# Patient Record
Sex: Male | Born: 1970 | Race: White | Hispanic: No | Marital: Single | Smoking: Former smoker
Health system: Southern US, Community
[De-identification: ages and names within clinical notes are randomized; demographics above are authoritative.]

## PROBLEM LIST (undated history)

## (undated) DIAGNOSIS — M549 Dorsalgia, unspecified: Secondary | ICD-10-CM

## (undated) DIAGNOSIS — F419 Anxiety disorder, unspecified: Secondary | ICD-10-CM

## (undated) HISTORY — PX: ANKLE SURGERY: SHX546

## (undated) HISTORY — DX: Anxiety disorder, unspecified: F41.9

## (undated) HISTORY — DX: Dorsalgia, unspecified: M54.9

---

## 1999-04-30 ENCOUNTER — Emergency Department (HOSPITAL_COMMUNITY): Admission: EM | Admit: 1999-04-30 | Discharge: 1999-04-30 | Payer: Self-pay | Admitting: Emergency Medicine

## 1999-05-07 ENCOUNTER — Emergency Department (HOSPITAL_COMMUNITY): Admission: EM | Admit: 1999-05-07 | Discharge: 1999-05-07 | Payer: Self-pay | Admitting: Emergency Medicine

## 2001-04-21 ENCOUNTER — Encounter: Payer: Self-pay | Admitting: *Deleted

## 2001-04-21 ENCOUNTER — Encounter: Admission: RE | Admit: 2001-04-21 | Discharge: 2001-04-21 | Payer: Self-pay | Admitting: *Deleted

## 2007-11-11 ENCOUNTER — Emergency Department (HOSPITAL_COMMUNITY): Admission: EM | Admit: 2007-11-11 | Discharge: 2007-11-11 | Payer: Self-pay | Admitting: Emergency Medicine

## 2009-11-08 ENCOUNTER — Ambulatory Visit (HOSPITAL_COMMUNITY): Admission: RE | Admit: 2009-11-08 | Discharge: 2009-11-08 | Payer: Self-pay | Admitting: Gastroenterology

## 2009-11-16 ENCOUNTER — Ambulatory Visit (HOSPITAL_COMMUNITY): Admission: RE | Admit: 2009-11-16 | Discharge: 2009-11-16 | Payer: Self-pay | Admitting: Gastroenterology

## 2009-11-24 ENCOUNTER — Encounter: Admission: RE | Admit: 2009-11-24 | Discharge: 2009-11-24 | Payer: Self-pay | Admitting: Gastroenterology

## 2010-06-24 ENCOUNTER — Encounter: Payer: Self-pay | Admitting: Gastroenterology

## 2010-10-12 ENCOUNTER — Emergency Department (HOSPITAL_COMMUNITY)
Admission: EM | Admit: 2010-10-12 | Discharge: 2010-10-13 | Disposition: A | Discharge status: Home / Self Care | Payer: Self-pay | Attending: Emergency Medicine | Admitting: Emergency Medicine

## 2010-10-12 DIAGNOSIS — F1410 Cocaine abuse, uncomplicated: Secondary | ICD-10-CM | POA: Insufficient documentation

## 2010-10-12 DIAGNOSIS — Z79899 Other long term (current) drug therapy: Secondary | ICD-10-CM | POA: Insufficient documentation

## 2010-10-12 DIAGNOSIS — F329 Major depressive disorder, single episode, unspecified: Secondary | ICD-10-CM | POA: Insufficient documentation

## 2010-10-12 DIAGNOSIS — F3289 Other specified depressive episodes: Secondary | ICD-10-CM | POA: Insufficient documentation

## 2010-10-12 DIAGNOSIS — F1010 Alcohol abuse, uncomplicated: Secondary | ICD-10-CM | POA: Insufficient documentation

## 2010-10-12 LAB — COMPREHENSIVE METABOLIC PANEL
Albumin: 4.2 g/dL (ref 3.5–5.2)
BUN: 11 mg/dL (ref 6–23)
Calcium: 9.9 mg/dL (ref 8.4–10.5)
Chloride: 102 mEq/L (ref 96–112)
Creatinine, Ser: 1.02 mg/dL (ref 0.4–1.5)
GFR calc Af Amer: >60 mL/min (ref >60)
Total Bilirubin: 0.4 mg/dL (ref 0.3–1.2)

## 2010-10-12 LAB — URINALYSIS, ROUTINE W REFLEX MICROSCOPIC
Bilirubin Urine: NEGATIVE
Hgb urine dipstick: NEGATIVE
Ketones, ur: NEGATIVE mg/dL
Nitrite: NEGATIVE
Specific Gravity, Urine: 1.022 (ref 1.005–1.030)
pH: 5.5 (ref 5.0–8.0)

## 2010-10-12 LAB — DIFFERENTIAL
Basophils Absolute: 0 10*3/uL (ref 0.0–0.1)
Lymphocytes Relative: 20 % (ref 12–46)
Lymphs Abs: 1.6 10*3/uL (ref 0.7–4.0)
Neutro Abs: 5.1 10*3/uL (ref 1.7–7.7)

## 2010-10-12 LAB — CBC
HCT: 50.4 % (ref 39.0–52.0)
Hemoglobin: 17 g/dL (ref 13.0–17.0)
MCV: 94.6 fL (ref 78.0–100.0)
RBC: 5.33 MIL/uL (ref 4.22–5.81)
WBC: 7.7 10*3/uL (ref 4.0–10.5)

## 2010-10-12 LAB — URINE MICROSCOPIC-ADD ON

## 2010-10-12 LAB — RAPID URINE DRUG SCREEN, HOSP PERFORMED
Amphetamines: NOT DETECTED
Benzodiazepines: POSITIVE — AB
Cocaine: POSITIVE — AB
Opiates: NOT DETECTED
Tetrahydrocannabinol: NOT DETECTED

## 2013-05-10 ENCOUNTER — Encounter (HOSPITAL_COMMUNITY): Payer: Self-pay | Admitting: Emergency Medicine

## 2013-05-10 ENCOUNTER — Emergency Department (HOSPITAL_COMMUNITY)
Admission: EM | Admit: 2013-05-10 | Discharge: 2013-05-10 | Disposition: A | Discharge status: Home / Self Care | Payer: BC Managed Care – PPO | Attending: Emergency Medicine | Admitting: Emergency Medicine

## 2013-05-10 ENCOUNTER — Emergency Department (HOSPITAL_COMMUNITY): Payer: BC Managed Care – PPO

## 2013-05-10 DIAGNOSIS — R1013 Epigastric pain: Secondary | ICD-10-CM | POA: Insufficient documentation

## 2013-05-10 DIAGNOSIS — E876 Hypokalemia: Secondary | ICD-10-CM | POA: Insufficient documentation

## 2013-05-10 DIAGNOSIS — R109 Unspecified abdominal pain: Secondary | ICD-10-CM

## 2013-05-10 DIAGNOSIS — J029 Acute pharyngitis, unspecified: Secondary | ICD-10-CM | POA: Insufficient documentation

## 2013-05-10 DIAGNOSIS — R160 Hepatomegaly, not elsewhere classified: Secondary | ICD-10-CM

## 2013-05-10 DIAGNOSIS — K920 Hematemesis: Secondary | ICD-10-CM | POA: Insufficient documentation

## 2013-05-10 LAB — URINALYSIS, ROUTINE W REFLEX MICROSCOPIC
Bilirubin Urine: NEGATIVE
Glucose, UA: NEGATIVE mg/dL
Ketones, ur: NEGATIVE mg/dL
pH: 7.5 (ref 5.0–8.0)

## 2013-05-10 LAB — CBC
Hemoglobin: 16.4 g/dL (ref 13.0–17.0)
MCV: 92.9 fL (ref 78.0–100.0)
Platelets: 261 10*3/uL (ref 150–400)
RBC: 5.07 MIL/uL (ref 4.22–5.81)
WBC: 15.8 10*3/uL — ABNORMAL HIGH (ref 4.0–10.5)

## 2013-05-10 LAB — COMPREHENSIVE METABOLIC PANEL
ALT: 47 U/L (ref 0–53)
AST: 35 U/L (ref 0–37)
CO2: 28 mEq/L (ref 19–32)
Chloride: 96 mEq/L (ref 96–112)
Creatinine, Ser: 1.09 mg/dL (ref 0.50–1.35)
GFR calc Af Amer: >90 mL/min (ref >90)
GFR calc non Af Amer: 81 mL/min — ABNORMAL LOW (ref >90)
Glucose, Bld: 107 mg/dL — ABNORMAL HIGH (ref 70–99)
Sodium: 138 mEq/L (ref 135–145)
Total Bilirubin: 0.8 mg/dL (ref 0.3–1.2)

## 2013-05-10 LAB — POCT I-STAT, CHEM 8
BUN: 9 mg/dL (ref 6–23)
Chloride: 95 mEq/L — ABNORMAL LOW (ref 96–112)
Creatinine, Ser: 1.1 mg/dL (ref 0.50–1.35)
Glucose, Bld: 113 mg/dL — ABNORMAL HIGH (ref 70–99)
HCT: 53 % — ABNORMAL HIGH (ref 39.0–52.0)
Potassium: 2.6 mEq/L — CL (ref 3.5–5.1)
Sodium: 140 mEq/L (ref 135–145)

## 2013-05-10 MED ORDER — POTASSIUM CHLORIDE CRYS ER 20 MEQ PO TBCR
40.0000 meq | EXTENDED_RELEASE_TABLET | Freq: Once | ORAL | Status: AC
  Administered 2013-05-10: 40 meq via ORAL
  Filled 2013-05-10: qty 2

## 2013-05-10 MED ORDER — MAGIC MOUTHWASH W/LIDOCAINE: 5.0000 mL | Freq: Four times a day (QID) | ORAL | Status: DC | PRN

## 2013-05-10 MED ORDER — ONDANSETRON HCL 4 MG PO TABS: 4.0000 mg | ORAL_TABLET | Freq: Four times a day (QID) | ORAL | Status: DC

## 2013-05-10 MED ORDER — SODIUM CHLORIDE 0.9 % IV BOLUS (SEPSIS)
1000.0000 mL | Freq: Once | INTRAVENOUS | Status: AC
  Administered 2013-05-10: 1000 mL via INTRAVENOUS

## 2013-05-10 MED ORDER — FENTANYL CITRATE 0.05 MG/ML IJ SOLN
50.0000 ug | INTRAMUSCULAR | Status: DC | PRN
  Administered 2013-05-10 (×2): 50 ug via INTRAVENOUS
  Filled 2013-05-10 (×2): qty 2

## 2013-05-10 MED ORDER — ONDANSETRON HCL 4 MG/2ML IJ SOLN
4.0000 mg | Freq: Once | INTRAMUSCULAR | Status: AC
  Administered 2013-05-10: 4 mg via INTRAVENOUS
  Filled 2013-05-10: qty 2

## 2013-05-10 MED ORDER — FAMOTIDINE 20 MG PO TABS: 20.0000 mg | ORAL_TABLET | Freq: Two times a day (BID) | ORAL | Status: DC

## 2013-05-10 MED ORDER — GI COCKTAIL ~~LOC~~
30.0000 mL | Freq: Once | ORAL | Status: AC
  Administered 2013-05-10: 30 mL via ORAL
  Filled 2013-05-10: qty 30

## 2013-05-10 MED ORDER — IOHEXOL 300 MG/ML  SOLN
100.0000 mL | Freq: Once | INTRAMUSCULAR | Status: AC | PRN
  Administered 2013-05-10: 100 mL via INTRAVENOUS

## 2013-05-10 MED ORDER — PANTOPRAZOLE SODIUM 40 MG IV SOLR
40.0000 mg | Freq: Once | INTRAVENOUS | Status: AC
  Administered 2013-05-10: 40 mg via INTRAVENOUS
  Filled 2013-05-10: qty 40

## 2013-05-10 MED ORDER — IOHEXOL 300 MG/ML  SOLN
50.0000 mL | Freq: Once | INTRAMUSCULAR | Status: AC | PRN
  Administered 2013-05-10: 50 mL via ORAL

## 2013-05-10 MED ORDER — POTASSIUM CHLORIDE 10 MEQ/100ML IV SOLN
10.0000 meq | Freq: Once | INTRAVENOUS | Status: AC
  Administered 2013-05-10: 10 meq via INTRAVENOUS
  Filled 2013-05-10: qty 100

## 2013-05-10 MED ORDER — SODIUM CHLORIDE 0.9 % IV SOLN
INTRAVENOUS | Status: DC
  Administered 2013-05-10: 04:00:00 via INTRAVENOUS

## 2013-05-10 NOTE — ED Provider Notes (Signed)
CSN: [id number]     Arrival date & time 05/10/13  0300 History   First MD Initiated Contact with Patient 05/10/13 [phone]     Chief Complaint  Patient presents with  . Emesis   (Consider location/radiation/quality/duration/timing/severity/associated sxs/prior Treatment) Patient is a 42 y.o. male presenting with vomiting.  Emesis Associated symptoms: abdominal pain   Associated symptoms: no chills and no headaches    Hx per PT  - epigastric ABD pain with N/V tonight, multiple episodes of emesis followed by bright red blood in emesis. Onset tonight, felt febrile with symptoms, no diarrhea, no sick contacts, no recent travel. Now has sore throat from vomiting, no CP or SOB. Symptoms mod in severity. PT noted to have h/o alcohol abuse, denies any recent etoh. Denies any blood in stool or any black or tarry stools  Past Medical History  Diagnosis Date  . Alcohol abuse    Past Surgical History  Procedure Laterality Date  . Ankle surgery     No family history on file. History  Substance Use Topics  . Smoking status: Not on file  . Smokeless tobacco: Not on file  . Alcohol Use: Not on file    Review of Systems  Constitutional: Negative for fever and chills.  Eyes: Negative for pain.  Respiratory: Negative for shortness of breath.   Cardiovascular: Negative for chest pain.  Gastrointestinal: Positive for vomiting and abdominal pain. Negative for blood in stool.  Genitourinary: Negative for dysuria.  Musculoskeletal: Negative for back pain, neck pain and neck stiffness.  Skin: Negative for rash.  Neurological: Negative for headaches.  All other systems reviewed and are negative.    Allergies  Review of patient's allergies indicates no known allergies.  Home Medications   Current Outpatient Rx  Name  Route  Sig  Dispense  Refill  . ibuprofen (ADVIL,MOTRIN) 200 MG tablet   Oral   Take 400 mg by mouth every 6 (six) hours as needed (pain).          BP 130/91  Pulse 95   Temp(Src) 99.5 F (37.5 C) (Oral)  Resp 13  SpO2 96% Physical Exam  Constitutional: He is oriented to person, place, and time. He appears well-developed and well-nourished.  HENT:  Head: Normocephalic and atraumatic.  Mildly dry mm, posterior pharynx clear  Eyes: EOM are normal. Pupils are equal, round, and reactive to light.  Neck: Normal range of motion. Neck supple. No tracheal deviation present.  Cardiovascular: Normal rate, regular rhythm and intact distal pulses.   Pulmonary/Chest: Effort normal and breath sounds normal. No stridor. No respiratory distress.  Abdominal: Soft. Bowel sounds are normal. He exhibits no distension. There is no rebound and no guarding.  Epigastric tenderness and some mid ABD tenderness, no RUQ tenderness and neg Murphys sign  Musculoskeletal: Normal range of motion. He exhibits no edema.  Neurological: He is alert and oriented to person, place, and time.  Skin: Skin is warm and dry.    ED Course  Procedures (including critical care time) Labs Review Labs Reviewed  CBC - Abnormal; Notable for the following:    WBC 15.8 (*)    All other components within normal limits  COMPREHENSIVE METABOLIC PANEL - Abnormal; Notable for the following:    Potassium 3.2 (*)    Glucose, Bld 107 (*)    GFR calc non Af Amer 81 (*)    All other components within normal limits  URINALYSIS, ROUTINE W REFLEX MICROSCOPIC - Abnormal; Notable for the following:  Protein, ur 30 (*)    Leukocytes, UA TRACE (*)    All other components within normal limits  ETHANOL  LIPASE, BLOOD  URINE MICROSCOPIC-ADD ON   Imaging Review Ct Abdomen Pelvis W Contrast  05/10/2013   CLINICAL DATA:  Abdominal pain, nausea and vomiting with bright red blood.  EXAM: CT ABDOMEN AND PELVIS WITH CONTRAST  TECHNIQUE: Multidetector CT imaging of the abdomen and pelvis was performed using the standard protocol following bolus administration of intravenous contrast.  CONTRAST:  OMNIPAQUE IOHEXOL  300 MG/ML  SOLN  COMPARISON:  CT of the abdomen and pelvis November 24, 2009.  FINDINGS: Mild motion degraded evaluation of the lung bases, which are grossly clear. Included heart and pericardium are nonsuspicious.  The liver is enlarged and diffusely hypodense, most consistent with fatty infiltration. Enhancing 16 mm nodule in right lobe of the liver peripherally shows centripetal puddling on prior examination most consistent with a hemangioma. The spleen, gallbladder, pancreas and adrenal glands are unremarkable and unchanged.  Tiny hiatal hernia. The stomach, small and large bowel are normal in course and caliber without inflammatory changes. Normal appendix. No intraperitoneal free fluid nor free air.  Kidneys are unremarkable. Delayed imaging demonstrates symmetric prompt excretion of contrast into the proximal urinary collecting system. Great vessels are normal in course and caliber with mild calcific atherosclerosis. Urinary bladder is underdistended, and otherwise unremarkable. Prostate is not enlarged. No lymphadenopathy by CT size criteria. Scattered Schmorl's nodes.  IMPRESSION: No acute intra-abdominal or pelvic process.  Increased mild hepatomegaly with fatty liver and small hemangioma.   Electronically Signed   By: Awilda Metro   On: 05/10/2013 05:51   istat chem 8 reviewed: NA 140, K 2.6, Cl95, Co2 30, Glu 113, BUN 9, Crt 1.1, Hb 18, Hct 53  IVFs, IV fentanyl, IV zofran, IV protonix  IV potassium and PO potassium provided  6:54 AM symptoms improved - no emesis in ED. ABD exam s/nd, minimal epigastric tenderness.  Now has more throat discomfort than abd pain. GI cocktail provided. Plan d/c home RX pepcid, magic mouth wash, zofran.  He will f/u PCP [facility] and GI referral provided. Return precautions verbalized as understood.   MDM  Dx: ABD pain, hematemesis, hypokalemia  Labs, UA, CT scan Improved with IVfs and medications Serial evaluations VS and nurses notes reviewed/  considered  Sunnie Nielsen, MD 05/10/13 [phone]

## 2013-05-10 NOTE — ED Notes (Signed)
Pt was given a gingerale and tolerated without any emesis.

## 2013-05-10 NOTE — ED Notes (Signed)
Per pt report: pt c/o sore throat x 3 days.  Fever of 101 and vomiting bring red blood "a lot" x 2. Pt c/o abd pain 8/10.

## 2013-05-10 NOTE — Progress Notes (Signed)
Incoming call from [employer]Clarification needed on Miracle Mouth Wash order from patients 05/10/13 ED visit. Patient demographics verified in EPIC. Pharmacist Request sheet completed and taken to PA in Fast Track.  Information relayed by this Case Manager to Dr Laveda Norman PA. Epic APPLICATIONS ARE DOWN IN FAST TRACK. This Case Production designer, theatre/television/film verified Incoming call from [employer]They Request clarification On Magic Mouth wash order solution.  Dr Laveda Norman states one to one Magic Mouthwash solution w/Lidocaine.Case Manager will call back Pharmacist.

## 2013-05-10 NOTE — Progress Notes (Signed)
Case Manager contacted [facility] Pharmacist per Dr Laveda Norman PA instruction.EPIC read of Magic mouth wash - w/ lidocaine- order given.Pharmacist aware Dr Laveda Norman had given instructions via This RN to state the ratio of one to one for the magic mouth wash solution.Pharmacist informed our ED is having EPIC application Issues and to call back if she had further questions. No further CM needs identified.

## 2013-05-10 NOTE — ED Notes (Signed)
I stat chem 8 given to Dr. Dierdre Highman with critical value.

## 2013-05-10 NOTE — ED Notes (Signed)
POCT occult Blood Stool resulted neg.

## 2014-03-29 ENCOUNTER — Emergency Department (HOSPITAL_COMMUNITY)
Admission: EM | Admit: 2014-03-29 | Discharge: 2014-03-29 | Discharge status: Against Medical Advice | Payer: BC Managed Care – PPO | Attending: Emergency Medicine | Admitting: Emergency Medicine

## 2014-03-29 ENCOUNTER — Encounter (HOSPITAL_COMMUNITY): Payer: Self-pay | Admitting: Emergency Medicine

## 2014-03-29 DIAGNOSIS — F10129 Alcohol abuse with intoxication, unspecified: Secondary | ICD-10-CM | POA: Insufficient documentation

## 2014-03-29 DIAGNOSIS — F1410 Cocaine abuse, uncomplicated: Secondary | ICD-10-CM | POA: Insufficient documentation

## 2014-03-29 DIAGNOSIS — Z720 Tobacco use: Secondary | ICD-10-CM | POA: Insufficient documentation

## 2014-03-29 DIAGNOSIS — R002 Palpitations: Secondary | ICD-10-CM | POA: Insufficient documentation

## 2014-03-29 LAB — CBC
HCT: 52.2 % — ABNORMAL HIGH (ref 39.0–52.0)
Hemoglobin: 18.1 g/dL — ABNORMAL HIGH (ref 13.0–17.0)
MCH: 32.7 pg (ref 26.0–34.0)
MCHC: 34.7 g/dL (ref 30.0–36.0)
MCV: 94.2 fL (ref 78.0–100.0)
PLATELETS: 281 10*3/uL (ref 150–400)
RBC: 5.54 MIL/uL (ref 4.22–5.81)
RDW: 13.9 % (ref 11.5–15.5)
WBC: 9.9 10*3/uL (ref 4.0–10.5)

## 2014-03-29 LAB — BASIC METABOLIC PANEL
ANION GAP: 17 — AB (ref 5–15)
BUN: 12 mg/dL (ref 6–23)
CHLORIDE: 98 meq/L (ref 96–112)
CO2: 25 meq/L (ref 19–32)
Calcium: 9.8 mg/dL (ref 8.4–10.5)
Creatinine, Ser: 1.07 mg/dL (ref 0.50–1.35)
GFR calc non Af Amer: 83 mL/min — ABNORMAL LOW (ref >90)
Glucose, Bld: 90 mg/dL (ref 70–99)
Potassium: 5.1 mEq/L (ref 3.7–5.3)
Sodium: 140 mEq/L (ref 137–147)

## 2014-03-29 LAB — I-STAT TROPONIN, ED: TROPONIN I, POC: 0 ng/mL (ref 0.00–0.08)

## 2014-03-29 NOTE — ED Notes (Signed)
Pt called for a room x one; no response

## 2014-03-29 NOTE — ED Notes (Signed)
Pt presents with c/o palpitations and alcohol intoxication. Pt reports that he drank approx 8 bottles of wine today and did "alot" of cocaine. Pt also reports taking one xanax at 7:30 this morning. Pt reports feelings palpitations at this time. Pt denies any SI or HI.

## 2014-03-30 ENCOUNTER — Encounter (HOSPITAL_COMMUNITY): Payer: Self-pay | Admitting: Emergency Medicine

## 2014-03-30 ENCOUNTER — Emergency Department (HOSPITAL_COMMUNITY)
Admission: EM | Admit: 2014-03-30 | Discharge: 2014-03-30 | Disposition: A | Discharge status: Home / Self Care | Payer: BC Managed Care – PPO | Attending: Emergency Medicine | Admitting: Emergency Medicine

## 2014-03-30 DIAGNOSIS — Z720 Tobacco use: Secondary | ICD-10-CM | POA: Insufficient documentation

## 2014-03-30 DIAGNOSIS — F1410 Cocaine abuse, uncomplicated: Secondary | ICD-10-CM

## 2014-03-30 DIAGNOSIS — F1010 Alcohol abuse, uncomplicated: Secondary | ICD-10-CM | POA: Insufficient documentation

## 2014-03-30 DIAGNOSIS — F1310 Sedative, hypnotic or anxiolytic abuse, uncomplicated: Secondary | ICD-10-CM | POA: Insufficient documentation

## 2014-03-30 DIAGNOSIS — Z79899 Other long term (current) drug therapy: Secondary | ICD-10-CM | POA: Insufficient documentation

## 2014-03-30 DIAGNOSIS — F1910 Other psychoactive substance abuse, uncomplicated: Secondary | ICD-10-CM

## 2014-03-30 LAB — BASIC METABOLIC PANEL
Anion gap: 19 — ABNORMAL HIGH (ref 5–15)
BUN: 14 mg/dL (ref 6–23)
CHLORIDE: 96 meq/L (ref 96–112)
CO2: 20 mEq/L (ref 19–32)
Calcium: 9.4 mg/dL (ref 8.4–10.5)
Creatinine, Ser: 0.98 mg/dL (ref 0.50–1.35)
Glucose, Bld: 140 mg/dL — ABNORMAL HIGH (ref 70–99)
Potassium: 4.6 mEq/L (ref 3.7–5.3)
SODIUM: 135 meq/L — AB (ref 137–147)

## 2014-03-30 LAB — CBC WITH DIFFERENTIAL/PLATELET
Basophils Absolute: 0.1 10*3/uL (ref 0.0–0.1)
Basophils Relative: 1 % (ref 0–1)
EOS ABS: 0.3 10*3/uL (ref 0.0–0.7)
Eosinophils Relative: 5 % (ref 0–5)
HCT: 47.7 % (ref 39.0–52.0)
HEMOGLOBIN: 16.6 g/dL (ref 13.0–17.0)
LYMPHS PCT: 27 % (ref 12–46)
Lymphs Abs: 1.9 10*3/uL (ref 0.7–4.0)
MCH: 32.2 pg (ref 26.0–34.0)
MCHC: 34.8 g/dL (ref 30.0–36.0)
MCV: 92.6 fL (ref 78.0–100.0)
MONOS PCT: 10 % (ref 3–12)
Monocytes Absolute: 0.7 10*3/uL (ref 0.1–1.0)
NEUTROS ABS: 4 10*3/uL (ref 1.7–7.7)
NEUTROS PCT: 57 % (ref 43–77)
PLATELETS: 279 10*3/uL (ref 150–400)
RBC: 5.15 MIL/uL (ref 4.22–5.81)
RDW: 13.8 % (ref 11.5–15.5)
WBC: 6.9 10*3/uL (ref 4.0–10.5)

## 2014-03-30 LAB — RAPID URINE DRUG SCREEN, HOSP PERFORMED
Amphetamines: NOT DETECTED
Barbiturates: NOT DETECTED
Benzodiazepines: POSITIVE — AB
COCAINE: POSITIVE — AB
OPIATES: NOT DETECTED
Tetrahydrocannabinol: NOT DETECTED

## 2014-03-30 LAB — ETHANOL: Alcohol, Ethyl (B): <11 mg/dL (ref 0–11)

## 2014-03-30 MED ORDER — LORAZEPAM 1 MG PO TABS: 0.0000 mg | ORAL_TABLET | Freq: Two times a day (BID) | ORAL | Status: DC

## 2014-03-30 MED ORDER — LORAZEPAM 1 MG PO TABS
ORAL_TABLET | ORAL | Status: AC
  Filled 2014-03-30: qty 2

## 2014-03-30 MED ORDER — VITAMIN B-1 100 MG PO TABS
100.0000 mg | ORAL_TABLET | Freq: Every day | ORAL | Status: DC
  Administered 2014-03-30: 100 mg via ORAL
  Filled 2014-03-30: qty 1

## 2014-03-30 MED ORDER — THIAMINE HCL 100 MG/ML IJ SOLN: 100.0000 mg | Freq: Every day | INTRAMUSCULAR | Status: DC

## 2014-03-30 MED ORDER — LORAZEPAM 1 MG PO TABS
0.0000 mg | ORAL_TABLET | Freq: Four times a day (QID) | ORAL | Status: DC
  Administered 2014-03-30: 2 mg via ORAL
  Administered 2014-03-30 (×2): 1 mg via ORAL
  Administered 2014-03-30: 2 mg via ORAL
  Filled 2014-03-30: qty 2
  Filled 2014-03-30 (×2): qty 1

## 2014-03-30 MED ORDER — NICOTINE 21 MG/24HR TD PT24
21.0000 mg | MEDICATED_PATCH | Freq: Every day | TRANSDERMAL | Status: DC | PRN
  Filled 2014-03-30: qty 1

## 2014-03-30 NOTE — ED Notes (Addendum)
Pt. Belongings are blue and white cap, yellowish black addias shoes, jean levis pants, black and gray socks, blue and white shirt, black casio watch, black windows phone big screen face, multi color belt, brown wallet with [employer] and state employee debit cards, [employer] car keys. Nurse was notified.

## 2014-03-30 NOTE — ED Provider Notes (Signed)
CSN: [id number]     Arrival date & time 03/30/14  [id number] History   First MD Initiated Contact with Patient 03/30/14 [phone]     Chief Complaint  Patient presents with  . Etoh detox   . Cocaine Detox       HPI Pt was seen at 0935. Per pt, c/o gradual onset and persistence of constant polysubstance abuse for the past several years. LD of etoh and cocaine last night; states he "feels shaky" at this time. Pt is requesting detox. Pt endorses [facility]. Denies hx DT's. Denies HI, no SI, no hallucinations.    Past Medical History  Diagnosis Date  . Alcohol abuse    Past Surgical History  Procedure Laterality Date  . Ankle surgery      History  Substance Use Topics  . Smoking status: Current Some Day Smoker  . Smokeless tobacco: Not on file  . Alcohol Use: Yes     Comment: daily     Review of Systems ROS: Statement: All systems negative except as marked or noted in the HPI; Constitutional: Negative for fever and chills. +"feel shakey." ; ; Eyes: Negative for eye pain, redness and discharge. ; ; ENMT: Negative for ear pain, hoarseness, nasal congestion, sinus pressure and sore throat. ; ; Cardiovascular: Negative for chest pain, palpitations, diaphoresis, dyspnea and peripheral edema. ; ; Respiratory: Negative for cough, wheezing and stridor. ; ; Gastrointestinal: Negative for nausea, vomiting, diarrhea, abdominal pain, blood in stool, hematemesis, jaundice and rectal bleeding. . ; ; Genitourinary: Negative for dysuria, flank pain and hematuria. ; ; Musculoskeletal: Negative for back pain and neck pain. Negative for swelling and trauma.; ; Skin: Negative for pruritus, rash, abrasions, blisters, bruising and skin lesion.; ; Neuro: Negative for headache, lightheadedness and neck stiffness. Negative for weakness, altered level of consciousness , altered mental status, extremity weakness, paresthesias, involuntary movement, seizure and syncope.; Psych:  No  SI, no SA, no HI, no hallucinations.    Allergies  Review of patient's allergies indicates no known allergies.  Home Medications   Prior to Admission medications   Medication Sig Start Date End Date Taking? Authorizing Provider  Alum & Mag Hydroxide-Simeth (MAGIC MOUTHWASH W/LIDOCAINE) SOLN Take 5 mLs by mouth 4 (four) times daily as needed for mouth pain. 05/10/13   Sunnie NielsenBrian Opitz, MD  famotidine (PEPCID) 20 MG tablet Take 1 tablet (20 mg total) by mouth 2 (two) times daily. 05/10/13   Sunnie NielsenBrian Opitz, MD  ibuprofen (ADVIL,MOTRIN) 200 MG tablet Take 400 mg by mouth every 6 (six) hours as needed (pain).    Historical Provider, MD  ondansetron (ZOFRAN) 4 MG tablet Take 1 tablet (4 mg total) by mouth every 6 (six) hours. 05/10/13   Sunnie NielsenBrian Opitz, MD   BP 143/91  Pulse 107  Temp(Src) 98.1 F (36.7 C) (Oral)  Resp 18  SpO2 100% Physical Exam 0940: Physical examination:  Nursing notes reviewed; Vital signs and O2 SAT reviewed;  Constitutional: Well developed, Well nourished, Well hydrated, In no acute distress; Head:  Normocephalic, atraumatic; Eyes: EOMI, PERRL, No scleral icterus; ENMT: Mouth and pharynx normal, Mucous membranes moist; Neck: Supple, Full range of motion, No lymphadenopathy; Cardiovascular: Regular rate and rhythm, No murmur, rub, or gallop; Respiratory: Breath sounds clear & equal bilaterally, No rales, rhonchi, wheezes.  Speaking full sentences with ease, Normal respiratory effort/excursion; Chest: Nontender, Movement normal; Abdomen: Soft, Nontender, Nondistended, Normal bowel sounds; Genitourinary: No CVA tenderness; Extremities: Pulses normal, No tenderness,  No edema, No calf edema or asymmetry.; Neuro: AA&Ox3, Major CN grossly intact. Speech clear. No gross focal motor or sensory deficits in extremities. +tremorous. ; Skin: Color normal, Warm, Dry.; Psych:  Affect flat, poor eye contact.     ED Course  Procedures     EKG Interpretation None      MDM  MDM Reviewed:  previous chart, nursing note and vitals Reviewed previous: labs Interpretation: labs    Results for orders placed during the hospital encounter of 03/30/14  URINE RAPID DRUG SCREEN (HOSP PERFORMED)      Result Value Ref Range   Opiates NONE DETECTED  NONE DETECTED   Cocaine POSITIVE (*) NONE DETECTED   Benzodiazepines POSITIVE (*) NONE DETECTED   Amphetamines NONE DETECTED  NONE DETECTED   Tetrahydrocannabinol NONE DETECTED  NONE DETECTED   Barbiturates NONE DETECTED  NONE DETECTED  ETHANOL      Result Value Ref Range   Alcohol, Ethyl (B) <11  0 - 11 mg/dL  BASIC METABOLIC PANEL      Result Value Ref Range   Sodium 135 (*) 137 - 147 mEq/L   Potassium 4.6  3.7 - 5.3 mEq/L   Chloride 96  96 - 112 mEq/L   CO2 20  19 - 32 mEq/L   Glucose, Bld 140 (*) 70 - 99 mg/dL   BUN 14  6 - 23 mg/dL   Creatinine, Ser 0.450.98  0.50 - 1.35 mg/dL   Calcium 9.4  8.4 - 40.910.5 mg/dL   GFR calc non Af Amer >90  >90 mL/min   GFR calc Af Amer >90  >90 mL/min   Anion gap 19 (*) 5 - 15  CBC WITH DIFFERENTIAL      Result Value Ref Range   WBC 6.9  4.0 - 10.5 K/uL   RBC 5.15  4.22 - 5.81 MIL/uL   Hemoglobin 16.6  13.0 - 17.0 g/dL   HCT 81.147.7  91.439.0 - 78.252.0 %   MCV 92.6  78.0 - 100.0 fL   MCH 32.2  26.0 - 34.0 pg   MCHC 34.8  30.0 - 36.0 g/dL   RDW 95.613.8  21.311.5 - 08.615.5 %   Platelets 279  150 - 400 K/uL   Neutrophils Relative % 57  43 - 77 %   Neutro Abs 4.0  1.7 - 7.7 K/uL   Lymphocytes Relative 27  12 - 46 %   Lymphs Abs 1.9  0.7 - 4.0 K/uL   Monocytes Relative 10  3 - 12 %   Monocytes Absolute 0.7  0.1 - 1.0 K/uL   Eosinophils Relative 5  0 - 5 %   Eosinophils Absolute 0.3  0.0 - 0.7 K/uL   Basophils Relative 1  0 - 1 %   Basophils Absolute 0.1  0.0 - 0.1 K/uL    1100:  CIWA protocol ordered. TTS eval pending.     Samuel JesterKathleen Eli Pattillo, DO 03/30/14 1609

## 2014-03-30 NOTE — ED Notes (Signed)
AVS provided and reviewed. Understanding verbalized. Patient denied SI/HI/AVH and denied pain. He offered no questions or concerns. [facility]. He left in no acute distress. Escorted off the unit, belongings returned and directed to the exit.

## 2014-03-30 NOTE — Progress Notes (Signed)
  CARE MANAGEMENT ED NOTE 03/30/2014  Patient:  Dustin Blevins,Dustin Blevins   Account Number:  [id number]  Date Initiated:  03/30/2014  Documentation initiated by:  Radford PaxFERRERO,Jamison Yuhasz  Subjective/Objective Assessment:   Patient presents to ED with heart palpitations and alcohol intoxication     Subjective/Objective Assessment Detail:   Patient with pmhx of substance abuse.     Action/Plan:   Action/Plan Detail:   Anticipated DC Date:       Status Recommendation to Physician:   Result of Recommendation:    Other ED Services  Consult Working Plan    DC Planning Services  Other  PCP issues    Choice offered to / List presented to:            Status of service:  Completed, signed off  ED Comments:   ED Comments Detail:  EDCM spoke to patient at bedside.Patient confirms he does nto have BCBS insurnace anymore.  When he had insurance he was a patient [facility]  Patient reports he will contact his pcp office to see if they can assist him on cost, payment plan.  EDCM provide patient with [facility] to [facility], informed patient of services there and walk in times.  EDCM also provided patient with list of pcps who accept self pay patients, list of discount pharmacies and websites needymeds.org and GoodRX.com for medication assistance, phone number to inquire about the orange card, phone number to inquire about Mediciad, phone number to inquire about the Affordable Care Act, financial resources in the community such as local churches, [employer], urban ministries, and dental assistance for uninsured patients.  Patient thankfulf or resources.  No further EDCM needs at this time.

## 2014-03-30 NOTE — ED Notes (Signed)
Pt states that he needs detox from etoh.  Last drink was about 2300 last night.  Drinks about at least 20 beers per day or at least a fifth of vodka.  States that he also needs detox from cocaine.  Pt is visibly shaking.  States that he has been hearing ringing in his ears this morning but it has subsided.

## 2014-03-30 NOTE — BH Assessment (Signed)
[facility] Assessment Progress Note Accepted to [facility] per Melissa. They will p/u pt at 10pm. Staff should call report to [phone]  at 8 pm.

## 2014-03-30 NOTE — Consult Note (Signed)
Face to face evaluation and I agree with this note 

## 2014-03-30 NOTE — Consult Note (Signed)
[facility] Face-to-Face Psychiatry Consult   Reason for Consult:  Polysubstance abuse Referring Physician:  EDP  Dustin Blevins is an 42 y.o. male. Total Time spent with patient: 45 minutes  Assessment: AXIS I:  Alcohol Abuse and Substance Abuse AXIS II:  Deferred AXIS III:   Past Medical History  Diagnosis Date  . Alcohol abuse    AXIS IV:  other psychosocial or environmental problems AXIS V:  41-50 serious symptoms  Plan:  Inpatient detox  Subjective:   HPI:  Dustin Blevins is a 42 y.o. male patient presents to [facility] ED requesting alcohol detox.  Patient states that he drinks at least a fifth of liquor or more a day.  "I drank six bottles of wine yesterday."  Patient denies suicidal/homicidal ideation, psychosis, and paranoia.  Patient also denies history of seizures or DT's.  Patient states that he has been drinking daily for the last 3-4 months.  Patient also states that he will do any drug that he can get his hands on "alcohol, cocaine, anything I can get my hands on."  Patient does have prior history of rehab and detox services last 2-3 years ago.  Patient ETOH was < 11 but UDS positive for cocaine and benzo.  Patient states that he spoke to Gail at [facility] yesterday and was informed that they would have a bed for him.   HPI Elements:   Location:  alcohol abuse. Quality:  daily consumption of alcohol. Severity:  alcohol withdrawal. Timing:  3-4 month. Review of Systems  Constitutional: Negative for chills.  Respiratory: Negative for wheezing.   Musculoskeletal: Negative.   Neurological: Negative for dizziness, tremors, sensory change, seizures, loss of consciousness and weakness.  Psychiatric/Behavioral: Positive for substance abuse. Negative for depression, suicidal ideas, hallucinations and memory loss. The patient is not nervous/anxious and does not have insomnia.   All other systems reviewed and are negative. No family history on file.   Past Psychiatric History: Past Medical  History  Diagnosis Date  . Alcohol abuse     reports that he has been smoking.  He does not have any smokeless tobacco history on file. He reports that he drinks alcohol. He reports that he uses illicit drugs (Cocaine) about once per week. No family history on file.         Allergies:  No Known Allergies  ACT Assessment Complete:  Yes:    Educational Status    Risk to Self: Risk to self with the past 6 months Is patient at risk for suicide?: No Substance abuse history and/or treatment for substance abuse?: Yes  Risk to Others:    Abuse:    Prior Inpatient Therapy:    Prior Outpatient Therapy:    Additional Information:        Objective: Blood pressure 130/83, pulse 88, temperature 98.8 F (37.1 C), temperature source Oral, resp. rate 16, SpO2 99.00%.There is no weight on file to calculate BMI. Results for orders placed during the hospital encounter of 03/30/14 (from the past 72 hour(s))  ETHANOL     Status: None   Collection Time    03/30/14  9:36 AM      Result Value Ref Range   Alcohol, Ethyl (B) <11  0 - 11 mg/dL   Comment:            LOWEST DETECTABLE LIMIT FOR     SERUM ALCOHOL IS 11 mg/dL     FOR MEDICAL PURPOSES ONLY  BASIC METABOLIC PANEL       Status: Abnormal   Collection Time    03/30/14  9:36 AM      Result Value Ref Range   Sodium 135 (*) 137 - 147 mEq/L   Potassium 4.6  3.7 - 5.3 mEq/L   Comment: SLIGHT HEMOLYSIS     HEMOLYSIS AT THIS LEVEL MAY AFFECT RESULT   Chloride 96  96 - 112 mEq/L   CO2 20  19 - 32 mEq/L   Glucose, Bld 140 (*) 70 - 99 mg/dL   BUN 14  6 - 23 mg/dL   Creatinine, Ser 0.98  0.50 - 1.35 mg/dL   Calcium 9.4  8.4 - 10.5 mg/dL   GFR calc non Af Amer >90  >90 mL/min   GFR calc Af Amer >90  >90 mL/min   Comment: (NOTE)     The eGFR has been calculated using the CKD EPI equation.     This calculation has not been validated in all clinical situations.     eGFR's persistently <90 mL/min signify possible Chronic Kidney     Disease.    Anion gap 19 (*) 5 - 15  CBC WITH DIFFERENTIAL     Status: None   Collection Time    03/30/14  9:36 AM      Result Value Ref Range   WBC 6.9  4.0 - 10.5 K/uL   RBC 5.15  4.22 - 5.81 MIL/uL   Hemoglobin 16.6  13.0 - 17.0 g/dL   HCT 47.7  39.0 - 52.0 %   MCV 92.6  78.0 - 100.0 fL   MCH 32.2  26.0 - 34.0 pg   MCHC 34.8  30.0 - 36.0 g/dL   RDW 13.8  11.5 - 15.5 %   Platelets 279  150 - 400 K/uL   Neutrophils Relative % 57  43 - 77 %   Neutro Abs 4.0  1.7 - 7.7 K/uL   Lymphocytes Relative 27  12 - 46 %   Lymphs Abs 1.9  0.7 - 4.0 K/uL   Monocytes Relative 10  3 - 12 %   Monocytes Absolute 0.7  0.1 - 1.0 K/uL   Eosinophils Relative 5  0 - 5 %   Eosinophils Absolute 0.3  0.0 - 0.7 K/uL   Basophils Relative 1  0 - 1 %   Basophils Absolute 0.1  0.0 - 0.1 K/uL  URINE RAPID DRUG SCREEN (HOSP PERFORMED)     Status: Abnormal   Collection Time    03/30/14  9:55 AM      Result Value Ref Range   Opiates NONE DETECTED  NONE DETECTED   Cocaine POSITIVE (*) NONE DETECTED   Benzodiazepines POSITIVE (*) NONE DETECTED   Amphetamines NONE DETECTED  NONE DETECTED   Tetrahydrocannabinol NONE DETECTED  NONE DETECTED   Barbiturates NONE DETECTED  NONE DETECTED   Comment:            DRUG SCREEN FOR MEDICAL PURPOSES     ONLY.  IF CONFIRMATION IS NEEDED     FOR ANY PURPOSE, NOTIFY LAB     WITHIN 5 DAYS.                LOWEST DETECTABLE LIMITS     FOR URINE DRUG SCREEN     Drug Class       Cutoff (ng/mL)     Amphetamine      1000     Barbiturate      200     Benzodiazepine   200       Tricyclics       300     Opiates          300     Cocaine          300     THC              50   Labs are reviewed see.  Current Facility-Administered Medications  Medication Dose Route Frequency Provider Last Rate Last Dose  . LORazepam (ATIVAN) tablet 0-4 mg  0-4 mg Oral 4 times per day Kathleen McManus, DO   1 mg at 03/30/14 1107   Followed by  . [START ON 04/01/2014] LORazepam (ATIVAN) tablet 0-4 mg  0-4 mg  Oral Q12H Kathleen McManus, DO      . nicotine (NICODERM CQ - dosed in mg/24 hours) patch 21 mg  21 mg Transdermal Daily PRN Kathleen McManus, DO      . thiamine (VITAMIN B-1) tablet 100 mg  100 mg Oral Daily Kathleen McManus, DO   100 mg at 03/30/14 1015   Or  . thiamine (B-1) injection 100 mg  100 mg Intravenous Daily Kathleen McManus, DO       No current outpatient prescriptions on file.    Psychiatric Specialty Exam:     Blood pressure 130/83, pulse 88, temperature 98.8 F (37.1 C), temperature source Oral, resp. rate 16, SpO2 99.00%.There is no weight on file to calculate BMI.  General Appearance: Casual  Eye Contact::  Good  Speech:  Clear and Coherent and Normal Rate  Volume:  Normal  Mood:  Anxious  Affect:  Congruent  Thought Process:  Circumstantial and Goal Directed  Orientation:  Full (Time, Place, and Person)  Thought Content:  "I just need some help"  Suicidal Thoughts:  No  Homicidal Thoughts:  No  Memory:  Immediate;   Good Recent;   Good Remote;   Good  Judgement:  Fair  Insight:  Fair  Psychomotor Activity:  Normal  Concentration:  Fair  Recall:  Good  Fund of Knowledge:Good  Language: Good  Akathisia:  No  Handed:  Right  AIMS (if indicated):     Assets:  Communication Skills Desire for Improvement Housing Social Support  Sleep:      Musculoskeletal: Strength & Muscle Tone: within normal limits Gait & Station: normal Patient leans: N/A  Treatment Plan Summary: Inpatient detox.  TTS consulted with [facility] and was informed that they would have a bed for him at 10:30 PM tonight.    Rankin, Shuvon, FNP-BC 03/30/2014 1:35 PM 

## 2014-06-20 ENCOUNTER — Emergency Department (HOSPITAL_COMMUNITY)
Admission: EM | Admit: 2014-06-20 | Discharge: 2014-06-20 | Disposition: A | Discharge status: Home / Self Care | Payer: Self-pay | Attending: Emergency Medicine | Admitting: Emergency Medicine

## 2014-06-20 ENCOUNTER — Emergency Department (HOSPITAL_COMMUNITY): Payer: Self-pay

## 2014-06-20 ENCOUNTER — Encounter (HOSPITAL_COMMUNITY): Payer: Self-pay | Admitting: Emergency Medicine

## 2014-06-20 DIAGNOSIS — M549 Dorsalgia, unspecified: Secondary | ICD-10-CM

## 2014-06-20 DIAGNOSIS — H109 Unspecified conjunctivitis: Secondary | ICD-10-CM | POA: Insufficient documentation

## 2014-06-20 DIAGNOSIS — Z720 Tobacco use: Secondary | ICD-10-CM | POA: Insufficient documentation

## 2014-06-20 DIAGNOSIS — R300 Dysuria: Secondary | ICD-10-CM | POA: Insufficient documentation

## 2014-06-20 DIAGNOSIS — M545 Low back pain: Secondary | ICD-10-CM | POA: Insufficient documentation

## 2014-06-20 DIAGNOSIS — R109 Unspecified abdominal pain: Secondary | ICD-10-CM

## 2014-06-20 LAB — URINALYSIS, ROUTINE W REFLEX MICROSCOPIC
BILIRUBIN URINE: NEGATIVE
GLUCOSE, UA: NEGATIVE mg/dL
Hgb urine dipstick: NEGATIVE
Ketones, ur: NEGATIVE mg/dL
LEUKOCYTES UA: NEGATIVE
Nitrite: NEGATIVE
Protein, ur: NEGATIVE mg/dL
Specific Gravity, Urine: 1.025 (ref 1.005–1.030)
Urobilinogen, UA: 0.2 mg/dL (ref 0.0–1.0)
pH: 6.5 (ref 5.0–8.0)

## 2014-06-20 LAB — CBC WITH DIFFERENTIAL/PLATELET
BASOS ABS: 0 10*3/uL (ref 0.0–0.1)
BASOS PCT: 0 % (ref 0–1)
Eosinophils Absolute: 0.3 10*3/uL (ref 0.0–0.7)
Eosinophils Relative: 2 % (ref 0–5)
HEMATOCRIT: 47.4 % (ref 39.0–52.0)
Hemoglobin: 16.1 g/dL (ref 13.0–17.0)
LYMPHS ABS: 2.6 10*3/uL (ref 0.7–4.0)
LYMPHS PCT: 22 % (ref 12–46)
MCH: 31.6 pg (ref 26.0–34.0)
MCHC: 34 g/dL (ref 30.0–36.0)
MCV: 93.1 fL (ref 78.0–100.0)
MONO ABS: 1 10*3/uL (ref 0.1–1.0)
Monocytes Relative: 8 % (ref 3–12)
Neutro Abs: 7.7 10*3/uL (ref 1.7–7.7)
Neutrophils Relative %: 68 % (ref 43–77)
PLATELETS: 282 10*3/uL (ref 150–400)
RBC: 5.09 MIL/uL (ref 4.22–5.81)
RDW: 12.9 % (ref 11.5–15.5)
WBC: 11.5 10*3/uL — AB (ref 4.0–10.5)

## 2014-06-20 LAB — COMPREHENSIVE METABOLIC PANEL
ALBUMIN: 4.2 g/dL (ref 3.5–5.2)
ALT: 18 U/L (ref 0–53)
AST: 25 U/L (ref 0–37)
Alkaline Phosphatase: 50 U/L (ref 39–117)
Anion gap: 7 (ref 5–15)
BUN: 15 mg/dL (ref 6–23)
CO2: 29 mmol/L (ref 19–32)
Calcium: 9.3 mg/dL (ref 8.4–10.5)
Chloride: 102 mEq/L (ref 96–112)
Creatinine, Ser: 1.14 mg/dL (ref 0.50–1.35)
GFR calc non Af Amer: 76 mL/min — ABNORMAL LOW (ref >90)
GFR, EST AFRICAN AMERICAN: 88 mL/min — AB (ref >90)
GLUCOSE: 99 mg/dL (ref 70–99)
Potassium: 3.8 mmol/L (ref 3.5–5.1)
Sodium: 138 mmol/L (ref 135–145)
TOTAL PROTEIN: 7 g/dL (ref 6.0–8.3)
Total Bilirubin: 0.7 mg/dL (ref 0.3–1.2)

## 2014-06-20 LAB — LIPASE, BLOOD: Lipase: 72 U/L — ABNORMAL HIGH (ref 11–59)

## 2014-06-20 MED ORDER — ONDANSETRON 4 MG PO TBDP: 4.0000 mg | ORAL_TABLET | Freq: Three times a day (TID) | ORAL | Status: DC | PRN

## 2014-06-20 MED ORDER — KETOROLAC TROMETHAMINE 30 MG/ML IJ SOLN
30.0000 mg | Freq: Once | INTRAMUSCULAR | Status: AC
  Administered 2014-06-20: 30 mg via INTRAVENOUS
  Filled 2014-06-20: qty 1

## 2014-06-20 MED ORDER — POLYMYXIN B-TRIMETHOPRIM 10000-0.1 UNIT/ML-% OP SOLN
1.0000 [drp] | OPHTHALMIC | Status: DC
  Administered 2014-06-20: 1 [drp] via OPHTHALMIC
  Filled 2014-06-20: qty 10

## 2014-06-20 MED ORDER — MORPHINE SULFATE 4 MG/ML IJ SOLN
4.0000 mg | Freq: Once | INTRAMUSCULAR | Status: AC
  Administered 2014-06-20: 4 mg via INTRAVENOUS
  Filled 2014-06-20: qty 1

## 2014-06-20 MED ORDER — METHOCARBAMOL 500 MG PO TABS: 500.0000 mg | ORAL_TABLET | Freq: Two times a day (BID) | ORAL | Status: DC

## 2014-06-20 MED ORDER — OXYCODONE-ACETAMINOPHEN 5-325 MG PO TABS: 1.0000 | ORAL_TABLET | ORAL | Status: DC | PRN

## 2014-06-20 MED ORDER — ONDANSETRON HCL 4 MG/2ML IJ SOLN
4.0000 mg | Freq: Once | INTRAMUSCULAR | Status: AC
  Administered 2014-06-20: 4 mg via INTRAVENOUS
  Filled 2014-06-20: qty 2

## 2014-06-20 NOTE — ED Notes (Signed)
Eye drops sent home with patient

## 2014-06-20 NOTE — ED Notes (Signed)
Patient transported to CT 

## 2014-06-20 NOTE — Progress Notes (Signed)
  CARE MANAGEMENT ED NOTE 06/20/2014  Patient:  Dustin Blevins,Dustin Blevins   Account Number:  [id number]  Date Initiated:  06/20/2014  Documentation initiated by:  Radford PaxFERRERO,Cameran Pettey  Subjective/Objective Assessment:   Patient presents to Ed with lower back pain and vomiting for one day.     Subjective/Objective Assessment Detail:     Action/Plan:   Action/Plan Detail:   Anticipated DC Date:  06/20/2014     Status Recommendation to Physician:   Result of Recommendation:    Other ED Services  Consult Working Plan    DC Planning Services  Other  PCP issues  Outpatient Services - Pt will follow up    Choice offered to / List presented to:            Status of service:  Completed, signed off  ED Comments:   ED Comments Detail:  EDCM spoke to patient at bedside.  Patient confirms he does nto have a pcp or insurnace.  Patient [facility] and provided resources such as assist in finding a pcp.  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  Patient reports he no longer has the information [facility] and is requesting the resources again.    [facility], informed patient of services there and walk in times.  EDCM also provided patient with list of pcps who accept self pay patients, list of discount pharmacies and websites needymeds.org and GoodRX.com for medication assistance, phone number to inquire about the orange card, phone number to inquire about Mediciad, phone number to inquire about the Affordable Care Act, financial resources in the community such as local churches, salvation army, urban ministries, and dental assistance for uninsured patients.  Patient thankful  for resources.  No further EDCM needs at this time.

## 2014-06-20 NOTE — Discharge Instructions (Signed)
Take the prescribed medication as directed for pain/nausea. Return to the ED for new or worsening symptoms.

## 2014-06-20 NOTE — ED Provider Notes (Signed)
CSN: [id number]     Arrival date & time 06/20/14  1444 History   First MD Initiated Contact with Patient 06/20/14 1542     Chief Complaint  Patient presents with  . Emesis  . Back Pain     (Consider location/radiation/quality/duration/timing/severity/associated sxs/prior Treatment) The history is provided by the patient and medical records.    This is a 44 y.o M with hx of alcohol abuse, presenting to the ED for back pain, nausea, and vomiting of sudden onset last night.  Patient states he got home from work and developed intense lower back "cramping".  Denies injury, trauma, or falls.  States throughout the night he began developing some intermittent stabbing sensations in his back, worse on the left side.  Throughout the day today pain has continued, now with nausea/vomiting, dysuria, and dark colored urine.  He denies frank hematuria.   No fever, chills, sweats.  Patient thinks he may have had a kidney stone 20 years ago, no issues since then.  Father did have hx of kidney stones.  Patient also has concern for possible conjunctivitis. His daughter was seen and treated for the same recently. He states his right eye appears mildly red and awoke with a large amount of drainage in his right eye.  Patient does usually wear contact lenses, has started wearing his glasses since symptoms started.  Past Medical History  Diagnosis Date  . Alcohol abuse    Past Surgical History  Procedure Laterality Date  . Ankle surgery     No family history on file. History  Substance Use Topics  . Smoking status: Current Some Day Smoker  . Smokeless tobacco: Not on file  . Alcohol Use: Yes     Comment: daily     Review of Systems  Gastrointestinal: Positive for nausea and vomiting.  Genitourinary: Positive for dysuria.  Musculoskeletal: Positive for back pain.  All other systems reviewed and are negative.     Allergies  Review of patient's allergies indicates no known allergies.  Home  Medications   Prior to Admission medications   Not on File   BP 145/99 mmHg  Pulse 64  Temp(Src) 98.8 F (37.1 C) (Oral)  Resp 20  SpO2 99%   Physical Exam  Constitutional: He is oriented to person, place, and time. He appears well-developed and well-nourished. No distress.  HENT:  Head: Normocephalic and atraumatic.  Mouth/Throat: Oropharynx is clear and moist.  Eyes: EOM and lids are normal. Pupils are equal, round, and reactive to light. Right conjunctiva is injected.  Right conjunctiva mildly injected; EOMs intact and non-painful; mild upper lid erythema Left eye WNL  Neck: Normal range of motion. Neck supple.  Cardiovascular: Normal rate, regular rhythm and normal heart sounds.   Pulmonary/Chest: Effort normal and breath sounds normal. No respiratory distress. He has no wheezes.  Abdominal: Soft. Bowel sounds are normal. There is no tenderness. There is CVA tenderness. There is no guarding.  Bilateral CVA tenderness, L > R  Musculoskeletal: Normal range of motion.       Lumbar back: Normal.  Neurological: He is alert and oriented to person, place, and time.  Skin: Skin is warm and dry. He is not diaphoretic.  Psychiatric: He has a normal mood and affect.  Nursing note and vitals reviewed.   ED Course  Procedures (including critical care time) Labs Review Labs Reviewed  CBC WITH DIFFERENTIAL - Abnormal; Notable for the following:    WBC 11.5 (*)    All other components  within normal limits  COMPREHENSIVE METABOLIC PANEL - Abnormal; Notable for the following:    GFR calc non Af Amer 76 (*)    GFR calc Af Amer 88 (*)    All other components within normal limits  LIPASE, BLOOD - Abnormal; Notable for the following:    Lipase 72 (*)    All other components within normal limits  URINALYSIS, ROUTINE W REFLEX MICROSCOPIC    Imaging Review Ct Abdomen Pelvis Wo Contrast  06/20/2014   CLINICAL DATA:  Sudden onset of left flank pain today.  EXAM: CT ABDOMEN AND PELVIS  WITHOUT CONTRAST  TECHNIQUE: Multidetector CT imaging of the abdomen and pelvis was performed following the standard protocol without IV contrast.  COMPARISON:  05/10/2013  FINDINGS: Lower chest: The lung bases are clear. Stable nodular density in the right mobile lobe since 2011 is most likely an intrapulmonary lymph node. No pleural effusion. The heart is normal in size. No pericardial effusion. The distal esophagus is grossly normal.  Hepatobiliary: The unenhanced appearance of the liver is unremarkable. No focal hepatic lesions or intrahepatic biliary dilatation. The gallbladder is normal. No common bile duct dilatation.  Pancreas: Normal.  Spleen: Normal.  Adrenals/Urinary Tract: The adrenal glands are normal. Both kidneys are normal. No renal, ureteral or bladder calculi. No hydronephrosis.  Stomach/Bowel: The stomach, duodenum, small bowel and colon are unremarkable. No inflammatory changes, mass lesions or obstructive findings. Scattered colonic diverticulosis but no findings for acute diverticulitis. The terminal ileum is normal. The appendix is normal.  Vascular/Lymphatic: No mesenteric or retroperitoneal mass or adenopathy. The aorta is normal in caliber. No atherosclerotic calcifications.  Other: The bladder, prostate gland and seminal vesicles are unremarkable. No pelvic mass, adenopathy or free pelvic fluid collections. No inguinal mass or adenopathy.  Musculoskeletal: No significant bony findings.  IMPRESSION: No acute abdominal/ pelvic findings, mass lesions or adenopathy.  No renal, ureteral or bladder calculi.   Electronically Signed   By: Loralie Champagne M.D.   On: 06/20/2014 18:36     EKG Interpretation None      MDM   Final diagnoses:  Flank pain  Back pain, unspecified location  Dysuria  Conjunctivitis of right eye   44 year old male with back pain, nausea, vomiting.  He has no prior history of kidney stones. On exam, patient afebrile and in no acute distress. He does have  bilateral CVA tenderness, left worse than right. He also is concerned for conjunctivitis, daughter recently seen and treated for this. His right conjunctiva is mildly injected. Started on Polytrim drops in ED.  Lab work and u/a pending.  Patient given pain medications.  Labwork reassuring. UA without hematuria or signs of infection. After medications, patient still with some mild discomfort. He remains concern for possible kidney stone. CT scan was obtained which is negative for acute findings. Patient was discharged home with symptomatic care. Rx percocet, zofran, robaxin.  He will continue Polytrim drops that were given in the ED. Instructed to wear his classes until symptoms fully resolve and dispose of current pair of contact lenses.  Discussed plan with patient, he/she acknowledged understanding and agreed with plan of care.  Return precautions given for new or worsening symptoms.  Garlon Hatchet, PA-C 06/20/14 2024  Richardean Canal, MD 06/20/14 [phone]

## 2014-06-20 NOTE — ED Notes (Signed)
Pt c/o lower back pain and vomiting x 1 day. Pt states he may also have pink eye. No hx of kidney stones.

## 2014-08-29 ENCOUNTER — Encounter (HOSPITAL_COMMUNITY): Payer: Self-pay | Admitting: *Deleted

## 2014-08-29 ENCOUNTER — Emergency Department (HOSPITAL_COMMUNITY)
Admission: EM | Admit: 2014-08-29 | Discharge: 2014-08-30 | Disposition: A | Discharge status: Home / Self Care | Payer: Self-pay | Attending: Emergency Medicine | Admitting: Emergency Medicine

## 2014-08-29 ENCOUNTER — Emergency Department (HOSPITAL_COMMUNITY): Payer: Self-pay

## 2014-08-29 DIAGNOSIS — Z87442 Personal history of urinary calculi: Secondary | ICD-10-CM | POA: Insufficient documentation

## 2014-08-29 DIAGNOSIS — R109 Unspecified abdominal pain: Secondary | ICD-10-CM | POA: Insufficient documentation

## 2014-08-29 DIAGNOSIS — M545 Low back pain: Secondary | ICD-10-CM | POA: Insufficient documentation

## 2014-08-29 DIAGNOSIS — N289 Disorder of kidney and ureter, unspecified: Secondary | ICD-10-CM

## 2014-08-29 DIAGNOSIS — Z720 Tobacco use: Secondary | ICD-10-CM | POA: Insufficient documentation

## 2014-08-29 LAB — URINALYSIS, ROUTINE W REFLEX MICROSCOPIC
BILIRUBIN URINE: NEGATIVE
Glucose, UA: NEGATIVE mg/dL
Hgb urine dipstick: NEGATIVE
KETONES UR: NEGATIVE mg/dL
Leukocytes, UA: NEGATIVE
NITRITE: NEGATIVE
PROTEIN: NEGATIVE mg/dL
Specific Gravity, Urine: 1.018 (ref 1.005–1.030)
UROBILINOGEN UA: 0.2 mg/dL (ref 0.0–1.0)
pH: 6.5 (ref 5.0–8.0)

## 2014-08-29 LAB — COMPREHENSIVE METABOLIC PANEL
ALT: 29 U/L (ref 0–53)
ANION GAP: 4 — AB (ref 5–15)
AST: 28 U/L (ref 0–37)
Albumin: 4 g/dL (ref 3.5–5.2)
Alkaline Phosphatase: 50 U/L (ref 39–117)
BILIRUBIN TOTAL: 0.6 mg/dL (ref 0.3–1.2)
BUN: 10 mg/dL (ref 6–23)
CHLORIDE: 102 mmol/L (ref 96–112)
CO2: 28 mmol/L (ref 19–32)
CREATININE: 1.54 mg/dL — AB (ref 0.50–1.35)
Calcium: 8.9 mg/dL (ref 8.4–10.5)
GFR calc Af Amer: 61 mL/min — ABNORMAL LOW (ref >90)
GFR, EST NON AFRICAN AMERICAN: 53 mL/min — AB (ref >90)
Glucose, Bld: 98 mg/dL (ref 70–99)
Potassium: 4.2 mmol/L (ref 3.5–5.1)
Sodium: 134 mmol/L — ABNORMAL LOW (ref 135–145)
Total Protein: 6.8 g/dL (ref 6.0–8.3)

## 2014-08-29 LAB — CBC
HCT: 44.1 % (ref 39.0–52.0)
HEMOGLOBIN: 15 g/dL (ref 13.0–17.0)
MCH: 31.7 pg (ref 26.0–34.0)
MCHC: 34 g/dL (ref 30.0–36.0)
MCV: 93.2 fL (ref 78.0–100.0)
Platelets: 262 10*3/uL (ref 150–400)
RBC: 4.73 MIL/uL (ref 4.22–5.81)
RDW: 14 % (ref 11.5–15.5)
WBC: 7.3 10*3/uL (ref 4.0–10.5)

## 2014-08-29 MED ORDER — ONDANSETRON 4 MG PO TBDP
4.0000 mg | ORAL_TABLET | Freq: Once | ORAL | Status: AC
  Administered 2014-08-29: 4 mg via ORAL
  Filled 2014-08-29: qty 1

## 2014-08-29 MED ORDER — SODIUM CHLORIDE 0.9 % IV BOLUS (SEPSIS): 1000.0000 mL | Freq: Once | INTRAVENOUS | Status: DC

## 2014-08-29 MED ORDER — TRAMADOL HCL 50 MG PO TABS: 50.0000 mg | ORAL_TABLET | Freq: Four times a day (QID) | ORAL | Status: DC | PRN

## 2014-08-29 MED ORDER — KETOROLAC TROMETHAMINE 30 MG/ML IJ SOLN
30.0000 mg | Freq: Once | INTRAMUSCULAR | Status: AC
  Administered 2014-08-29: 30 mg via INTRAMUSCULAR
  Filled 2014-08-29: qty 1

## 2014-08-29 MED ORDER — OXYCODONE-ACETAMINOPHEN 5-325 MG PO TABS
1.0000 | ORAL_TABLET | Freq: Once | ORAL | Status: AC
  Administered 2014-08-29: 1 via ORAL
  Filled 2014-08-29: qty 1

## 2014-08-29 MED ORDER — METHOCARBAMOL 500 MG PO TABS: 500.0000 mg | ORAL_TABLET | Freq: Two times a day (BID) | ORAL | Status: DC

## 2014-08-29 NOTE — ED Notes (Signed)
PA at bedside.

## 2014-08-29 NOTE — Discharge Instructions (Signed)
Your pain may be related to a muscle strain.  Take pain medication and muscle relaxant as it may be able to help with your symptoms.  Avoid heavy lifting.  Your kidney function is abnormal, it may be due to dehydration.  Please drink plenty of fluid and follow up with your doctor this week for a recheck of your kidney function.  Return if your condition worsen or if you have other concerns.  Flank Pain Flank pain refers to pain that is located on the side of the body between the upper abdomen and the back. The pain may occur over a short period of time (acute) or may be long-term or reoccurring (chronic). It may be mild or severe. Flank pain can be caused by many things. CAUSES  Some of the more common causes of flank pain include:  Muscle strains.   Muscle spasms.   A disease of your spine (vertebral disk disease).   A lung infection (pneumonia).   Fluid around your lungs (pulmonary edema).   A kidney infection.   Kidney stones.   A very painful skin rash caused by the chickenpox virus (shingles).   Gallbladder disease.  HOME CARE INSTRUCTIONS  Home care will depend on the cause of your pain. In general,  Rest as directed by your caregiver.  Drink enough fluids to keep your urine clear or pale yellow.  Only take over-the-counter or prescription medicines as directed by your caregiver. Some medicines may help relieve the pain.  Tell your caregiver about any changes in your pain.  Follow up with your caregiver as directed. SEEK IMMEDIATE MEDICAL CARE IF:   Your pain is not controlled with medicine.   You have new or worsening symptoms.  Your pain increases.   You have abdominal pain.   You have shortness of breath.   You have persistent nausea or vomiting.   You have swelling in your abdomen.   You feel faint or pass out.   You have blood in your urine.  You have a fever or persistent symptoms for more than 2-3 days.  You have a fever and your  symptoms suddenly get worse. MAKE SURE YOU:   Understand these instructions.  Will watch your condition.  Will get help right away if you are not doing well or get worse. Document Released: 07/11/2005 Document Revised: 02/12/2012 Document Reviewed: 01/02/2012 [facility] Patient Information 2015 [place]. This information is not intended to replace advice given to you by your health care provider. Make sure you discuss any questions you have with your health care provider.

## 2014-08-29 NOTE — ED Notes (Signed)
Pt in c/o left flank pain that started two days ago, pain has been intermittent but today has been constant, recent kidney stone a few months ago and this feels similar, pain in testicles with urination, dysuria, also episode of vomiting this morning

## 2014-08-29 NOTE — ED Provider Notes (Signed)
CSN: [id number]     Arrival date & time 08/29/14  2018 History   None    Chief Complaint  Patient presents with  . Flank Pain     (Consider location/radiation/quality/duration/timing/severity/associated sxs/prior Treatment) HPI   44 year old male with history of kidney stones presents complaining of left flank pain. For the past 2-3 days patient has had persistent pain to his left flank and his left lower back. He described pain as a twisting sensation, nothing makes it better/worse. Reported prior history of kidney stones and this pain feels similar. He has tried taking ibuprofen, icyhot cream, ice pack and heating pad without improvement. Pain symptoms. Causing him to felt nauseous and vomit. Vomitus is nonbloody and nonbilious. He also endorsed 5 several bouts of nonbloody non mucousy diarrhea yesterday but none today. He endorsed chills without fever. He came to the ER at the urging of his family member. Patient admits that he work at a job where he does heavy lifting and has been doing some heavy lifting. He's unsure if this is related to lifting.  He did received a percocet in the waiting room today but sts it provide minimal relief.  Denies fever, cp, sob, productive cough, dysuria, hematuria, or rash.  Denies any significant abd pain.    Past Medical History  Diagnosis Date  . Alcohol abuse    Past Surgical History  Procedure Laterality Date  . Ankle surgery     History reviewed. No pertinent family history. History  Substance Use Topics  . Smoking status: Current Some Day Smoker  . Smokeless tobacco: Not on file  . Alcohol Use: Yes     Comment: daily     Review of Systems  All other systems reviewed and are negative.     Allergies  Review of patient's allergies indicates no known allergies.  Home Medications   Prior to Admission medications   Medication Sig Start Date End Date Taking? Authorizing Provider  ibuprofen (ADVIL,MOTRIN) 200 MG tablet Take 200 mg by  mouth every 6 (six) hours as needed for moderate pain.   Yes Historical Provider, MD  methocarbamol (ROBAXIN) 500 MG tablet Take 1 tablet (500 mg total) by mouth 2 (two) times daily. Patient not taking: Reported on 08/29/2014 06/20/14   Garlon Hatchet, PA-C  ondansetron (ZOFRAN ODT) 4 MG disintegrating tablet Take 1 tablet (4 mg total) by mouth every 8 (eight) hours as needed for nausea. Patient not taking: Reported on 08/29/2014 06/20/14   Garlon Hatchet, PA-C  oxyCODONE-acetaminophen (PERCOCET/ROXICET) 5-325 MG per tablet Take 1 tablet by mouth every 4 (four) hours as needed. Patient not taking: Reported on 08/29/2014 06/20/14   Garlon Hatchet, PA-C   BP 139/97 mmHg  Pulse 66  Temp(Src) 98 F (36.7 C) (Oral)  Resp 20  Wt 165 lb (74.844 kg)  SpO2 99% Physical Exam  Constitutional: He appears well-developed and well-nourished. No distress.  HENT:  Head: Atraumatic.  Eyes: Conjunctivae are normal.  Neck: Normal range of motion. Neck supple.  Cardiovascular: Normal rate and regular rhythm.   Pulmonary/Chest: Effort normal and breath sounds normal.  Abdominal: Soft. Bowel sounds are normal. He exhibits no distension. There is no tenderness.  Genitourinary: Penis normal.  No CVA tenderness  No inguinal hernia, no testicular tenderness or scrotal swelling.    Musculoskeletal: He exhibits tenderness (L paralumbar spinal muscle tenderness on palpation with normal flexion/extension/rotation.  No midline spine tenderness, no rash).  Neurological: He is alert.  Skin: No rash noted.  Psychiatric: He has a normal mood and affect.    ED Course  Procedures (including critical care time)  L flank pain, hx of kidney stone.  Pt has a benign abdomen.  Pt is afebrile, VSS.  Normal labs, normal UA, CT renal stone shows no acute finding.  Since pain is reproducible to L lower back and pt report heavy lifting, this could be MSK.  No red flags.  Pt able to ambulate,  Negative straight leg raise, no signs of  infection, no caudal equina sxs.  Therefore, will provide RICE therapy out outpt f/u.  Return precaution discussed.    As far as n/v/d, sxs has since resolved.  Recommend adequate fluid intake and to have renal function recheck as he has evidence of renal insufficiency with Cr 1.54 today.     Labs Review Labs Reviewed  COMPREHENSIVE METABOLIC PANEL - Abnormal; Notable for the following:    Sodium 134 (*)    Creatinine, Ser 1.54 (*)    GFR calc non Af Amer 53 (*)    GFR calc Af Amer 61 (*)    Anion gap 4 (*)    All other components within normal limits  CBC  URINALYSIS, ROUTINE W REFLEX MICROSCOPIC    Imaging Review Ct Renal Stone Study  08/29/2014   CLINICAL DATA:  Pt in c/o left flank pain that started two days ago, pain has been intermittent but today has been constant, recent kidney stone a few months ago and this feels similar, pain in testicles with urination, dysuria, also episode of vomiting this morning  EXAM: CT ABDOMEN AND PELVIS WITHOUT CONTRAST  TECHNIQUE: Multidetector CT imaging of the abdomen and pelvis was performed following the standard protocol without IV contrast.  COMPARISON:  CT, 06/20/2014  FINDINGS: Kidneys are normal in size, orientation and position. No renal masses or stones. No hydronephrosis normal ureters. Normal bladder.  Lung bases essentially clear.  Heart normal in size.  Liver, spleen, gallbladder, pancreas, adrenal glands:  Normal.  No pathologically enlarged lymph nodes. No abnormal fluid collections.  Normal colon and small bowel.  Normal appendix.  No significant bony abnormality.  IMPRESSION: 1. No acute findings. No renal or ureteral stones or obstructive uropathy. 2. Essentially normal exam.  No findings to explain left flank pain.   Electronically Signed   By: Amie Portlandavid  Ormond M.D.   On: 08/29/2014 22:24     EKG Interpretation None      MDM   Final diagnoses:  Left flank pain  Renal insufficiency    BP 125/87 mmHg  Pulse 70  Temp(Src) 98 F  (36.7 C) (Oral)  Resp 20  Wt 165 lb (74.844 kg)  SpO2 92%  I have reviewed nursing notes and vital signs. I personally reviewed the imaging tests through PACS system  I reviewed available ER/hospitalization records thought the EMR     Fayrene HelperBowie Jessie Schrieber, PA-C 08/29/14 [id number]  Mirian MoMatthew Gentry, MD 08/31/14 [phone]

## 2014-08-29 NOTE — ED Notes (Signed)
Patient requesting pain medication-PA made aware. 

## 2014-08-30 NOTE — ED Notes (Signed)
Pt left without receiving discharge instructions and paperwork, PA aware.

## 2015-01-10 ENCOUNTER — Telehealth: Payer: Self-pay | Admitting: *Deleted

## 2015-01-10 ENCOUNTER — Encounter: Payer: Self-pay | Admitting: *Deleted

## 2015-01-10 NOTE — Telephone Encounter (Signed)
Pre-Visit Call completed with patient and chart updated.   Pre-Visit Info documented in Specialty Comments under [employer].    

## 2015-01-11 ENCOUNTER — Encounter: Payer: Self-pay | Admitting: Physician Assistant

## 2015-01-11 ENCOUNTER — Ambulatory Visit (INDEPENDENT_AMBULATORY_CARE_PROVIDER_SITE_OTHER): Payer: Self-pay | Admitting: Physician Assistant

## 2015-01-11 VITALS — BP 120/90 | HR 80 | Temp 98.3°F | Ht 68.75 in | Wt 164.4 lb

## 2015-01-11 DIAGNOSIS — M545 Low back pain, unspecified: Secondary | ICD-10-CM

## 2015-01-11 DIAGNOSIS — F411 Generalized anxiety disorder: Secondary | ICD-10-CM

## 2015-01-11 MED ORDER — HYDROCODONE-ACETAMINOPHEN 10-325 MG PO TABS: 1.0000 | ORAL_TABLET | Freq: Three times a day (TID) | ORAL | Status: AC | PRN

## 2015-01-11 MED ORDER — DIAZEPAM 5 MG PO TABS: 5.0000 mg | ORAL_TABLET | Freq: Two times a day (BID) | ORAL | Status: DC | PRN
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
## 2015-01-11 NOTE — Patient Instructions (Signed)
Please take medications as directed only when needed for severe symptoms. Take 1/2 tablet of the Valium during the day if needed. Can take whole tablet at bedtime. Continue topical medications. Call with the name of the chiropractor you have seen so that we can get your imaging results.  Follow-up 1 month.

## 2015-01-11 NOTE — Progress Notes (Signed)
Pre visit review using our clinic review tool, if applicable. No additional management support is needed unless otherwise documented below in the visit note. 

## 2015-01-12 ENCOUNTER — Telehealth: Payer: Self-pay | Admitting: Physician Assistant

## 2015-01-12 NOTE — Telephone Encounter (Signed)
Pt called to provide info  [facility] Dr. Monika Salk - Chiropractor [phone]

## 2015-01-15 DIAGNOSIS — F411 Generalized anxiety disorder: Secondary | ICD-10-CM | POA: Insufficient documentation

## 2015-01-15 DIAGNOSIS — M545 Low back pain, unspecified: Secondary | ICD-10-CM | POA: Insufficient documentation

## 2015-01-15 NOTE — Assessment & Plan Note (Signed)
Chronic. No records for review. Was previously followed by Chiropractic Medicine. Will obtain records for review. Supportive measures discussed. Will Rx Norco for pain relief. Use only for severe pain. Patient aware we are not a chronic pain clinic and he will need referral to pain specialist or Orthopedic Surgery based on record review. CSC signed and UDS obtained.

## 2015-01-15 NOTE — Assessment & Plan Note (Signed)
Situational. Is following up with counseling. Rx Valium to use up to BID for muscle spasm and anxiety. CSC signed and UDS given. Follow-up 1 month. If persistent, will start SSRI therapy.

## 2015-01-16 NOTE — Telephone Encounter (Signed)
Can we please call this practice and request all records -- notes, imaging, etc for the patient?

## 2015-01-17 ENCOUNTER — Telehealth: Payer: Self-pay | Admitting: Physician Assistant

## 2015-01-17 NOTE — Telephone Encounter (Signed)
UDS results are in -- he tested positive for Valium before picking up the prescription that I gave. He also tested positive for lorazepam another benzodiazepine he is not prescribed, as well as Fentanyl a narcotic pain medication that he is prescribed. He did not make me aware of these other medications and he must be getting them from somewhere if they are not prescribed to him.  Going forward he is not to test positive for anything he not prescribed in the future. If he does we will no longer prescribe medications for him. I have requested records from his chiropractor and am waiting for them to come in for review. I would recommend he let me set him up with a pain specialist going forward. Also he is due for follow-up 1 month after last visit so we can assess mood.  He will have to give another sample before medication refills will be given.

## 2015-01-17 NOTE — Telephone Encounter (Signed)
Notified patient and he stated understanding.  He scheduled CPE for September and stated understanding of issues below.

## 2015-01-17 NOTE — Telephone Encounter (Signed)
Updated address and ph # on internet [facility] Address: [address] [place], [place] [address]  Phone: [phone]   I left msg requesting records be faxed to our office [phone]

## 2015-02-15 ENCOUNTER — Telehealth: Payer: Self-pay | Admitting: Physician Assistant

## 2015-02-15 ENCOUNTER — Encounter: Payer: Self-pay | Admitting: Physician Assistant

## 2015-03-01 NOTE — Telephone Encounter (Signed)
Pt was no show 02/15/15 1:30pm, cpe appt, per appt note pt left VM same day that he wouldn't be in, charge for no show?

## 2015-03-01 NOTE — Telephone Encounter (Signed)
24 hour notice needed. Charge

## 2015-03-07 ENCOUNTER — Telehealth: Payer: Self-pay | Admitting: Behavioral Health

## 2015-03-07 NOTE — Telephone Encounter (Signed)
Unable to reach patient at time of Pre-Visit Call.  Left message for patient to return call when available.    

## 2015-03-08 ENCOUNTER — Encounter: Payer: Self-pay | Admitting: Physician Assistant

## 2015-05-05 ENCOUNTER — Telehealth: Payer: Self-pay | Admitting: Physician Assistant

## 2015-05-05 NOTE — Telephone Encounter (Signed)
Left message on VM 12/2 @ 0905 for patient to call and schedule his Flu Shot....DLS

## 2015-05-05 NOTE — Telephone Encounter (Signed)
12/2 @ 0925, patient called back but states that he will have to have a day off to get in here. He will call back to schedule Flu Shot

## 2015-06-11 IMAGING — CT CT RENAL STONE PROTOCOL
2 of 4 series · 16 of 46 positions shown, 18 images · non-contrast
Comparison: CT, 06/20/2014

CLINICAL DATA: Pt in c/o left flank pain that started two days ago,
pain has been intermittent but today has been constant, recent
kidney stone a few months ago and this feels similar, pain in
testicles with urination, dysuria, also episode of vomiting this
morning

EXAM:
CT ABDOMEN AND PELVIS WITHOUT CONTRAST
TECHNIQUE: Multidetector CT imaging of the abdomen and pelvis was performed
following the standard protocol without IV contrast.

[Series 2: stone study 5.0 i30f 1 · axial · 0.74mm/px · z∈[+901,+1366]mm · 13 of 103 slices shown, 15 images]
[im 5/103  soft-tissue]
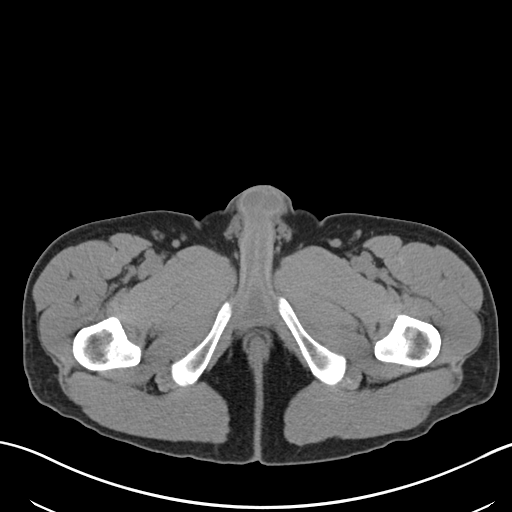
[im 5/103  bone]
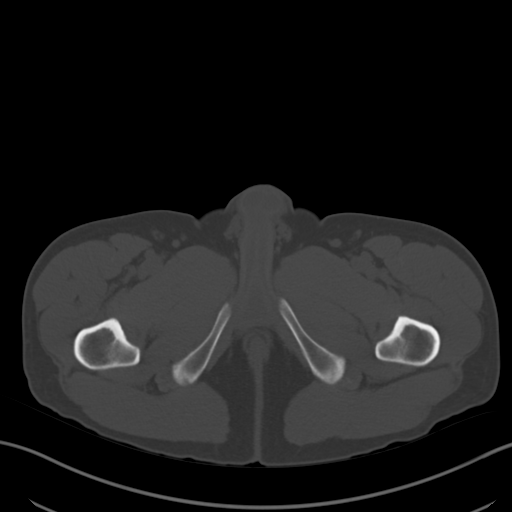
[im 14/103  soft-tissue]
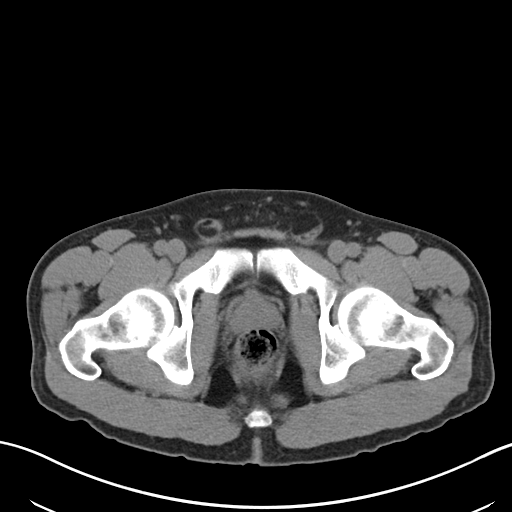
[im 23/103  soft-tissue]
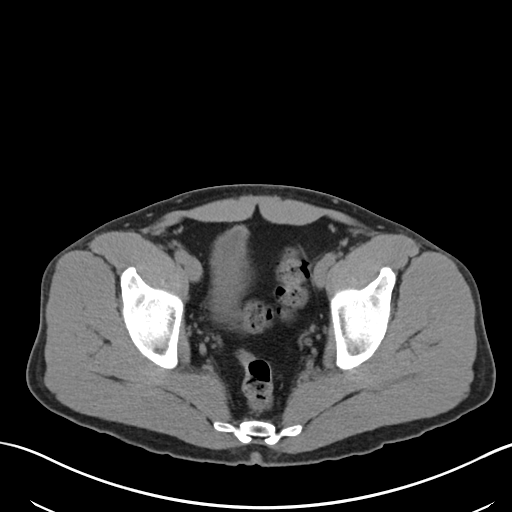
[im 27/103  soft-tissue]
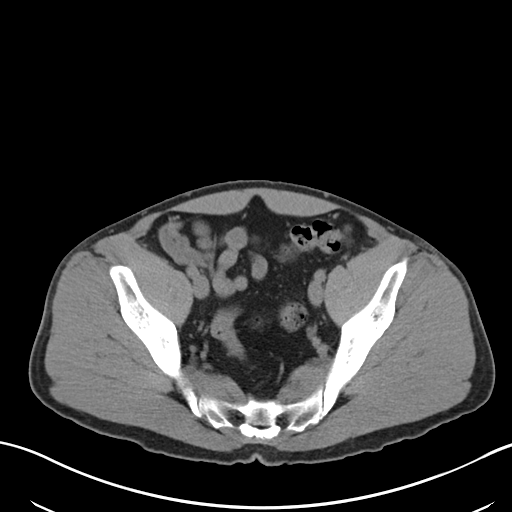
[im 36/103  soft-tissue]
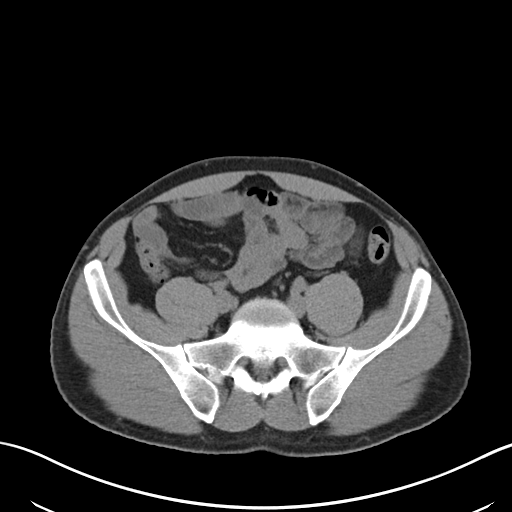
[im 45/103  soft-tissue]
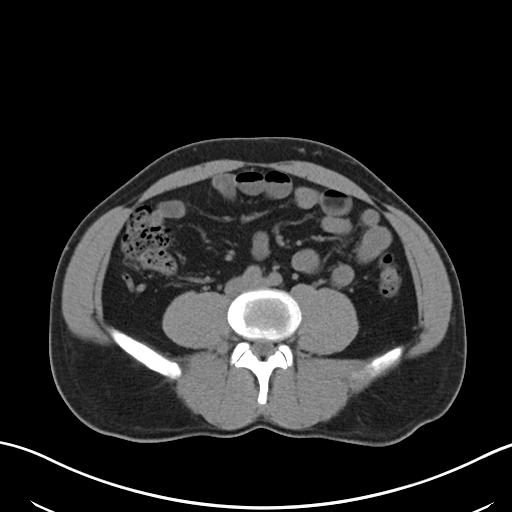
[im 54/103  soft-tissue]
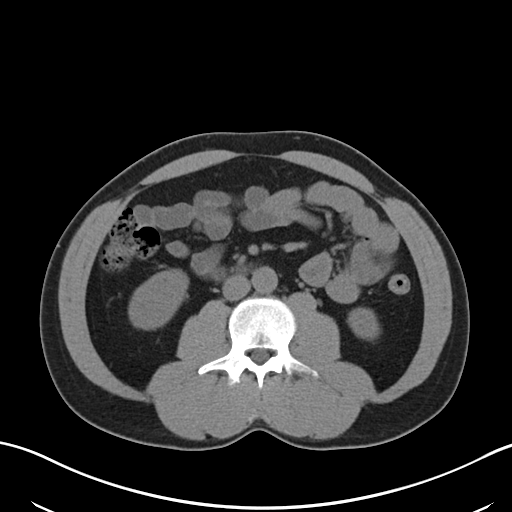
[im 58/103  soft-tissue]
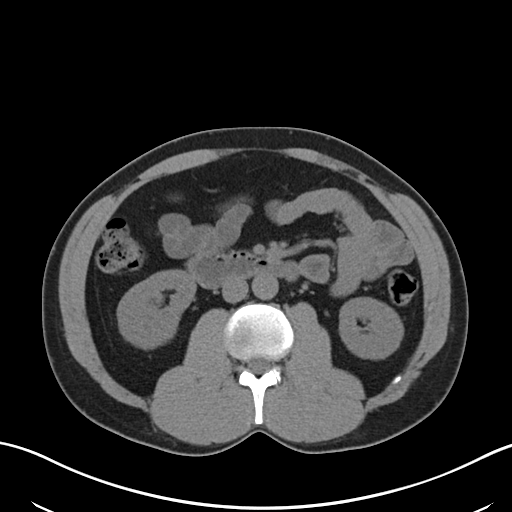
[im 67/103  soft-tissue]
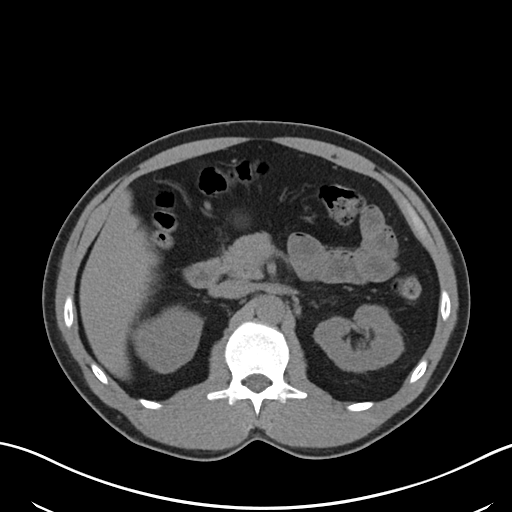
[im 67/103  bone]
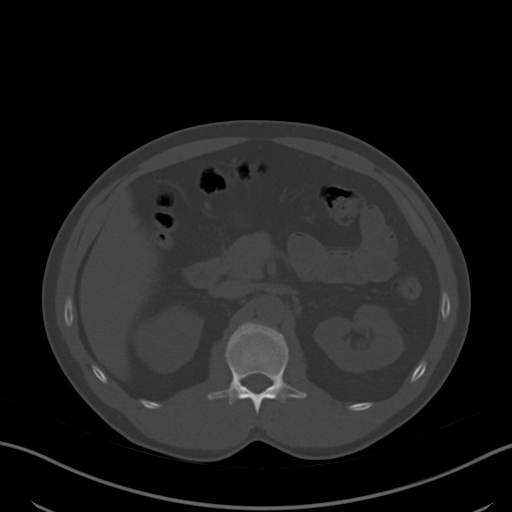
[im 76/103  soft-tissue]
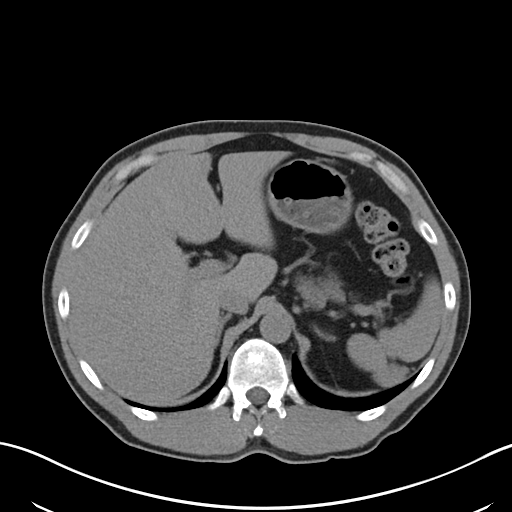
[im 80/103  soft-tissue]
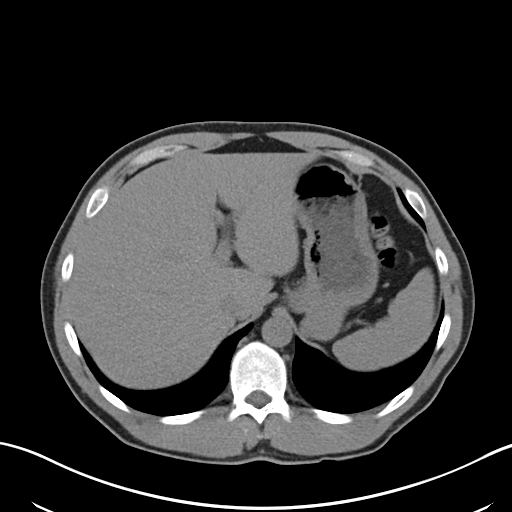
[im 89/103  soft-tissue]
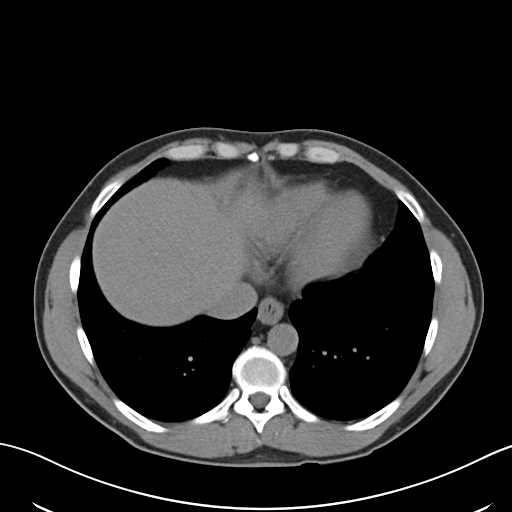
[im 98/103  soft-tissue]
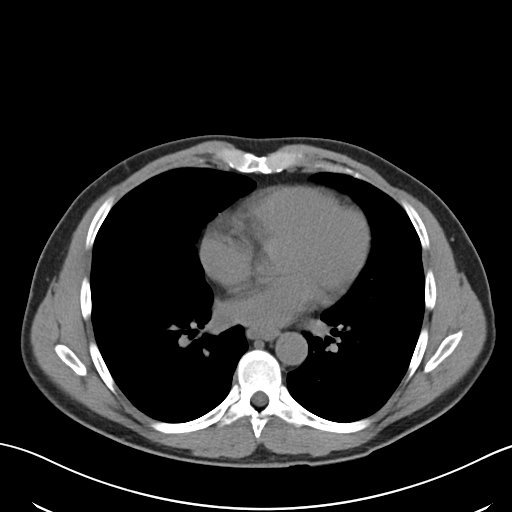

[Series 4: coronal soft tissue · coronal · 0.72mm/px · 3 of 121 slices shown]
[im 41/121  soft-tissue]
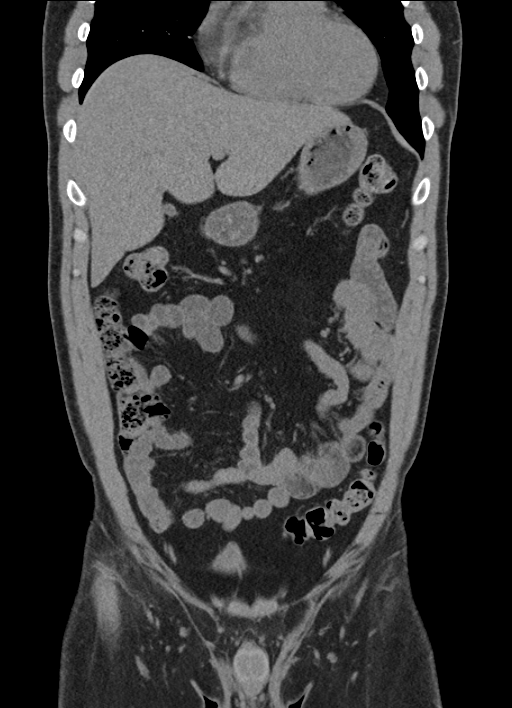
[im 54/121  soft-tissue]
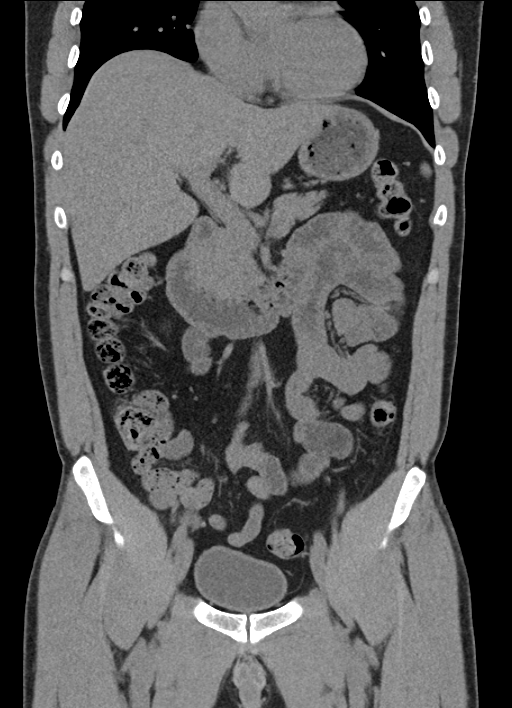
[im 67/121  soft-tissue]
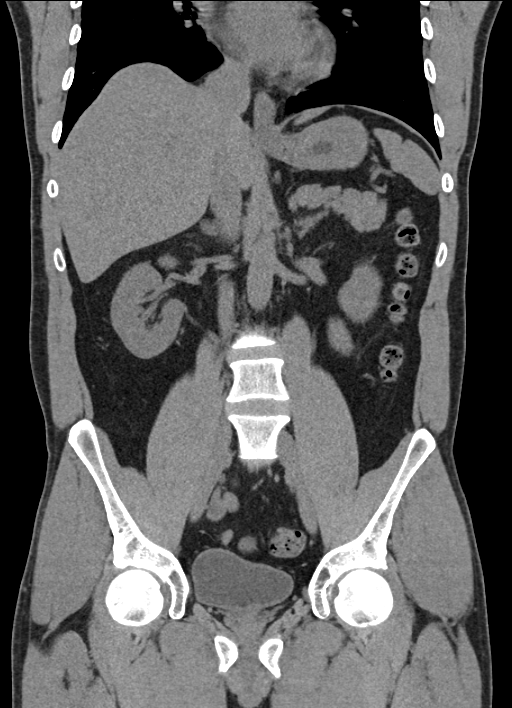

[16 of 46 positions shown; findings below may reference images not displayed]

FINDINGS: Kidneys are normal in size, orientation and position. No renal
masses or stones. No hydronephrosis normal ureters. Normal bladder.

Lung bases essentially clear.  Heart normal in size.

Liver, spleen, gallbladder, pancreas, adrenal glands:  Normal.

No pathologically enlarged lymph nodes. No abnormal fluid
collections.

Normal colon and small bowel.  Normal appendix.

No significant bony abnormality.
IMPRESSION: 1. No acute findings. No renal or ureteral stones or obstructive
uropathy.
2. Essentially normal exam.  No findings to explain left flank pain.

## 2015-06-14 ENCOUNTER — Ambulatory Visit: Payer: Self-pay | Admitting: Physician Assistant

## 2015-08-03 ENCOUNTER — Telehealth: Payer: Self-pay | Admitting: Physician Assistant

## 2015-08-03 NOTE — Telephone Encounter (Signed)
Pt had flu shot 03/2015 at [facility] in [place]. Scheduled pt for CPE 08/16/15.

## 2015-08-03 NOTE — Telephone Encounter (Signed)
Flu information documented

## 2015-08-15 ENCOUNTER — Telehealth: Payer: Self-pay

## 2015-08-15 NOTE — Telephone Encounter (Signed)
Called patient to update chart for  CPE scheduled to tomorrow. Patient states he will have to call back and reschedule has family in hospital and will not be able to come.

## 2015-08-16 ENCOUNTER — Encounter: Payer: Self-pay | Admitting: Physician Assistant

## 2017-10-21 ENCOUNTER — Encounter: Payer: Self-pay | Admitting: Emergency Medicine

## 2019-12-20 ENCOUNTER — Other Ambulatory Visit: Payer: Self-pay

## 2019-12-20 ENCOUNTER — Ambulatory Visit (HOSPITAL_COMMUNITY): Payer: No Payment, Other | Admitting: Licensed Clinical Social Worker

## 2020-01-06 ENCOUNTER — Telehealth (HOSPITAL_COMMUNITY): Payer: Self-pay

## 2020-01-22 ENCOUNTER — Telehealth (HOSPITAL_COMMUNITY): Payer: No Payment, Other | Admitting: Adult Health

## 2020-03-18 ENCOUNTER — Telehealth (HOSPITAL_COMMUNITY): Payer: No Payment, Other | Admitting: Adult Health

## 2020-03-24 ENCOUNTER — Other Ambulatory Visit: Payer: Self-pay

## 2020-03-24 ENCOUNTER — Telehealth (INDEPENDENT_AMBULATORY_CARE_PROVIDER_SITE_OTHER): Payer: No Payment, Other | Admitting: Physician Assistant

## 2020-03-24 DIAGNOSIS — F32A Depression, unspecified: Secondary | ICD-10-CM | POA: Insufficient documentation

## 2020-03-24 DIAGNOSIS — F411 Generalized anxiety disorder: Secondary | ICD-10-CM | POA: Diagnosis not present

## 2020-03-24 DIAGNOSIS — G4700 Insomnia, unspecified: Secondary | ICD-10-CM

## 2020-03-24 MED ORDER — DESVENLAFAXINE SUCCINATE ER 100 MG PO TB24: 100.0000 mg | ORAL_TABLET | Freq: Every day | ORAL | 2 refills | Status: DC

## 2020-03-24 MED ORDER — TRAZODONE HCL 100 MG PO TABS: 100.0000 mg | ORAL_TABLET | Freq: Three times a day (TID) | ORAL | 0 refills | Status: DC

## 2020-03-24 MED ORDER — HYDROXYZINE HCL 25 MG PO TABS: 25.0000 mg | ORAL_TABLET | Freq: Three times a day (TID) | ORAL | 1 refills | Status: AC | PRN

## 2020-03-29 ENCOUNTER — Encounter (HOSPITAL_COMMUNITY): Payer: Self-pay | Admitting: Physician Assistant

## 2020-03-29 NOTE — Progress Notes (Signed)
Psychiatric Initial Adult Assessment   Virtual Visit via Video Note  I connected with [name] on 03/29/20 at  4:00 PM EDT by a video enabled telemedicine application and verified that I am speaking with the correct person using two identifiers.  Location: Patient: [employer] [employer] Provider: Office   I discussed the limitations of evaluation and management by telemedicine and the availability of in person appointments. The patient expressed understanding and agreed to proceed.  Follow Up Instructions: I discussed the assessment and treatment plan with the patient. The patient was provided an opportunity to ask questions and all were answered. The patient agreed with the plan and demonstrated an understanding of the instructions.   The patient was advised to call back or seek an in-person evaluation if the symptoms worsen or if the condition fails to improve as anticipated.  I provided 60 minutes of non-face-to-face time during this encounter.   Meta Hatchet, PA   Patient Identification: Dustin Blevins MRN:  [id number] Date of Evaluation:  03/29/2020 Referral Source: N/A Chief Complaint:  New Patient/Medication Management Visit Diagnosis:    ICD-10-CM   1. Depression, unspecified depression type  F32.A desvenlafaxine (PRISTIQ) 100 MG 24 hr tablet  2. Anxiety state  F41.1 hydrOXYzine (ATARAX/VISTARIL) 25 MG tablet  3. Insomnia, unspecified type  G47.00 traZODone (DESYREL) 100 MG tablet    History of Present Illness:  Dustin Blevins is a 49 year old, male with a past psychiatric history significant for depression who presents to [facility] via virtual video visit for medication management. Per patient, he has been on depression medication for awhile. After quitting alcohol use, patient reports that his depression medication has been working much more effectively. Patient is currently being treated at a  treatment facility by the name of [facility]). Patient was admitted to the treatment facility on October 15th 2020. Patient reports that things are quite well at [facility] and is about to transition to housing work. Patient reports that he is able to receive passes to visit his family. Patient states that his treatment has been going well and he is appreciative of being able to speak to a counselor.  Patient is currently on the following medication:  Pristiq (Desvenlafaxine) 100 mg 24 hr tablet 1 time daily in the morning  Patient reports no issues with the medication but he reports that he has feelings of depression that come in waves. Patient reports that in the past when he would deal with these waves of depression, he would self medicatewith alcohol. Patient states he is now currently just dealing with the waves of emotions on his own. Patient attributes these waves of depression to the recent event of the passing of his girlfriend's son. Patient reports that the cause of death was by suicide and since the tragic event, he feels like his girlfriend is pushing him away. Patient reports that his anxiety has been through the roof. Patient reports that it is normal for him to feel high strung when things aren't going well. Patient reports that he used to take Xanax for the longest time for his anxiety but has since stopped due to the addictive nature of Xanax. Patient now takes Clonidine for anxiety at night but experiences breaking out of the elbows and reports feeling more anxious upon awakening. Patient wishes he had all the medications out of his life.  Patient denies current suicide ideation but reports he had a semi  attempt on his life where he took a bunch of opiates without a care of what would happen to him. This incident occurred 6 - 7 years ago. Patient denies homicide ideation. Patient further denies auditory and visual hallucinations. Patient reports that the only  time he has reported any hallucinations is while under the influence. Patient reports obtaining 6 hours of sleep provided he is able to stay asleep. Patient endorses appetite and tries to eat healthy having up to 3 meals a day. Patient denies illicit drug use and alcohol consumption. Patient smokes half a pack a day. Patient's main concern is his sleep and anxiety. Patient states that he has feelings of sadness up until the point he has been tearful. Patient states he feels lost and lonely and wonders if it is related to the passing of his girlfriend's son. Patient feels he may exhibit co-dependent nature. Patient was recommended trazodone and hydroxyzine for the management of his sleep disturbances and anxiety, respectively.  Associated Signs/Symptoms: Depression Symptoms:  depressed mood, anhedonia, fatigue, feelings of worthlessness/guilt, anxiety, disturbed sleep, (Hypo) Manic Symptoms:  Irritable Mood, Labiality of Mood, Anxiety Symptoms:  Panic Symptoms, Psychotic Symptoms:  n/a PTSD Symptoms: Re-experiencing:  Flashbacks Nightmares  Past Psychiatric History: Anxiety Depression  Previous Psychotropic Medications: Yes   Substance Abuse History in the last 12 months:  No.  Consequences of Substance Abuse: Medical Consequences:  Patient took a bunch of opitates without a care for what would happen to him Withdrawal Symptoms:   Diaphoresis Nausea Tremors  Past Medical History:  Past Medical History:  Diagnosis Date  . Anxiety   . Back pain     Past Surgical History:  Procedure Laterality Date  . ANKLE SURGERY      Family Psychiatric History: Unknown, but patient does report that his mother was on anti-depressants in the past  Family History:  Family History  Problem Relation Age of Onset  . Kidney Stones Father   . Cancer Father 16       Prostate  . Cancer Maternal Grandmother        cervical  . Healthy Child        79 years old currently    Social History:    Social History   Socioeconomic History  . Marital status: Single    Spouse name: Not on file  . Number of children: 1  . Years of education: Not on file  . Highest education level: Not on file  Occupational History  . Occupation: Advertising account planner  Tobacco Use  . Smoking status: Former Smoker    Quit date: 01/11/2000    Years since quitting: 20.2  . Smokeless tobacco: Never Used  . Tobacco comment: rare use when smoking -- < 1 pack per week  Substance and Sexual Activity  . Alcohol use: Yes    Alcohol/week: 0.0 standard drinks    Comment: daily   . Drug use: Yes    Frequency: 1.0 times per week    Types: Cocaine  . Sexual activity: Yes    Partners: Female  Other Topics Concern  . Not on file  Social History Narrative  . Not on file   Social Determinants of Health   Financial Resource Strain:   . Difficulty of Paying Living Expenses: Not on file  Food Insecurity:   . Worried About Programme researcher, broadcasting/film/video in the Last Year: Not on file  . Ran Out of Food in the Last Year: Not on file  Transportation Needs:   .  Lack of Transportation (Medical): Not on file  . Lack of Transportation (Non-Medical): Not on file  Physical Activity:   . Days of Exercise per Week: Not on file  . Minutes of Exercise per Session: Not on file  Stress:   . Feeling of Stress : Not on file  Social Connections:   . Frequency of Communication with Friends and Family: Not on file  . Frequency of Social Gatherings with Friends and Family: Not on file  . Attends Religious Services: Not on file  . Active Member of Clubs or Organizations: Not on file  . Attends Banker Meetings: Not on file  . Marital Status: Not on file    Additional Social History: Patient is currently being treated at [facility]  Allergies:  No Known Allergies  Metabolic Disorder Labs: No results found for: HGBA1C, MPG No results found for: PROLACTIN No results found for: CHOL, TRIG, HDL, CHOLHDL, VLDL,  LDLCALC No results found for: TSH  Therapeutic Level Labs: No results found for: LITHIUM No results found for: CBMZ No results found for: VALPROATE  Current Medications: Current Outpatient Medications  Medication Sig Dispense Refill  . acetaminophen (TYLENOL) 325 MG tablet Take 650 mg by mouth every 6 (six) hours as needed.    Melene Muller ON 04/21/2020] desvenlafaxine (PRISTIQ) 100 MG 24 hr tablet Take 1 tablet (100 mg total) by mouth daily. 30 tablet 2  . diazepam (VALIUM) 5 MG tablet Take 1 tablet (5 mg total) by mouth every 12 (twelve) hours as needed for anxiety. 30 tablet 1  . HYDROcodone-acetaminophen (NORCO) 10-325 MG per tablet Take 1 tablet by mouth every 8 (eight) hours as needed. 90 tablet 0  . hydrOXYzine (ATARAX/VISTARIL) 25 MG tablet Take 1 tablet (25 mg total) by mouth 3 (three) times daily as needed for anxiety. 90 tablet 1  . ibuprofen (ADVIL,MOTRIN) 200 MG tablet Take 200 mg by mouth every 6 (six) hours as needed for moderate pain.    Melene Muller ON 04/21/2020] traZODone (DESYREL) 100 MG tablet Take 1 tablet (100 mg total) by mouth 3 (three) times daily. Take 1 table (100 mg total) by mouth 3 (three) times daily at bedtime PRN 90 tablet 0   No current facility-administered medications for this visit.    Musculoskeletal: Strength & Muscle Tone: within normal limits Gait & Station: normal Patient leans: N/A  Psychiatric Specialty Exam: Review of Systems  There were no vitals taken for this visit.There is no height or weight on file to calculate BMI.  General Appearance: Well Groomed  Eye Contact:  Good  Speech:  Clear and Coherent and Normal Rate  Volume:  Normal  Mood:  Anxious and Depressed  Affect:  Congruent  Thought Process:  Coherent  Orientation:  Full (Time, Place, and Person)  Thought Content:  Logical  Suicidal Thoughts:  No  Homicidal Thoughts:  No  Memory:  Immediate;   Good Recent;   Good Remote;   Good  Judgement:  Good  Insight:  Good   Psychomotor Activity:  Restlessness  Concentration:  Concentration: Good and Attention Span: Good  Recall:  Good  Fund of Knowledge:Good  Language: Good  Akathisia:  NA  Handed:  Right  AIMS (if indicated):  not done  Assets:  Communication Skills Desire for Improvement Financial Resources/Insurance Physical Health Social Support  ADL's:  Intact  Cognition: WNL  Sleep:  Fair   Screenings:   Assessment and Plan: Davien Malone is a 49 year old, male with a  past psychiatric history significant for depression who [facility] virtual video visit for medication management. Per patient, he has been on depression medication for awhile. Patient reports that he has no current issues with his medications but he states that he has been experiencing waves of depression as of late. Patient also endorses sleep disturbances and anxiety. Patient was recommended trazodone and hydroxyzine for the management of his sleep disturbances and anxiety, respectively. Patient was agreeable to medication add ons.  1. Anxiety state  - hydrOXYzine (ATARAX/VISTARIL) 25 MG tablet; Take 1 tablet (25 mg total) by mouth 3 (three) times daily as needed for anxiety.  Dispense: 90 tablet; Refill: 1  2. Depression, unspecified depression type  - desvenlafaxine (PRISTIQ) 100 MG 24 hr tablet; Take 1 tablet (100 mg total) by mouth daily.  Dispense: 30 tablet; Refill: 2  3. Insomnia, unspecified type  - traZODone (DESYREL) 100 MG tablet; Take 1 tablet (100 mg total) by mouth 3 (three) times daily. Take 1 table (100 mg total) by mouth 3 (three) times daily at bedtime PRN  Dispense: 90 tablet; Refill: 0  Patient's depression to be reassessed during 4 week follow up appointment.  Meta HatchetUchenna E Angelos Wasco, PA 10/27/202110:59 PM

## 2020-04-20 ENCOUNTER — Other Ambulatory Visit: Payer: Self-pay

## 2020-04-20 ENCOUNTER — Telehealth (INDEPENDENT_AMBULATORY_CARE_PROVIDER_SITE_OTHER): Payer: No Payment, Other | Admitting: Physician Assistant

## 2020-04-20 DIAGNOSIS — F411 Generalized anxiety disorder: Secondary | ICD-10-CM

## 2020-04-20 DIAGNOSIS — F32A Depression, unspecified: Secondary | ICD-10-CM

## 2020-04-20 DIAGNOSIS — G4700 Insomnia, unspecified: Secondary | ICD-10-CM | POA: Diagnosis not present

## 2020-04-20 MED ORDER — GABAPENTIN 300 MG PO CAPS
300.0000 mg | ORAL_CAPSULE | Freq: Two times a day (BID) | ORAL | 1 refills | Status: DC
Start: 2020-04-20 — End: 2020-05-24

## 2020-04-20 MED ORDER — TRAZODONE HCL 50 MG PO TABS: 50.0000 mg | ORAL_TABLET | Freq: Every evening | ORAL | 1 refills | Status: DC | PRN

## 2020-04-20 MED ORDER — DESVENLAFAXINE SUCCINATE ER 100 MG PO TB24: 100.0000 mg | ORAL_TABLET | Freq: Every day | ORAL | 1 refills | Status: DC

## 2020-04-21 NOTE — Progress Notes (Signed)
BH MD/PA/NP OP Progress Note  Virtual Visit via Video Note  I connected with Dustin Blevins on 04/20/2020 at  3:30 PM EST by a video enabled telemedicine application and verified that I am speaking with the correct person using two identifiers.  Location: Patient: Home Provider: Clinic   I discussed the limitations of evaluation and management by telemedicine and the availability of in person appointments. The patient expressed understanding and agreed to proceed.  Follow Up Instructions:  I discussed the assessment and treatment plan with the patient. The patient was provided an opportunity to ask questions and all were answered. The patient agreed with the plan and demonstrated an understanding of the instructions.   The patient was advised to call back or seek an in-person evaluation if the symptoms worsen or if the condition fails to improve as anticipated.  I provided 35 minutes of non-face-to-face time during this encounter.  Meta Hatchet, PA   04/20/2020 3:34 AM [name]  MRN:  [id number]  Chief Complaint: Follow-up and medication management  HPI:  Dustin Blevins is a 49 year old male with a past psychiatric history significant for depression, anxiety, and insomnia who presents to [facility] via virtual video visit for follow-up and medication management.  Patient is currently being managed on the following medications:  Hydroxyzine 25 mg 3 times daily as needed Desvenlafaxine 100 mg 24-hour tablet Trazodone 100 mg at bedtime  Patient states that he has been doing well on his current regimen of medications, however, he is still experiencing anxiety.  Patient reports that he does not notice any change with his anxiety.  The use of hydroxyzine.  Patient reports that although his anxiety is usually higher during this particular time of the day, he is still feeling anxious while on hydroxyzine.  Before being placed on  hydroxyzine, patient was originally taking clonidine for the management of his anxiety.  Patient does have a history of benzodiazapine use and has used the following medications: alprazolam and lorazepam.  Patient reports that he often will take a drug screen due to his history of having a DUI.  Patient was recommended gabapentin for the management of his anxiety.  Patient to discontinue taking hydroxyzine and will be placed on gabapentin for the management of his anxiety.  Patient expresses that he would also like to eventually be on no medications and is requesting to titrate down on his dose of trazodone.  Patient will continue taking trazodone 50 mg at bedtime for the management of insomnia.  Patient reports that he is in a better mood compared to the last few weeks he has had.  Patient denies suicidal and homicidal ideations.  He further denies auditory and visual hallucinations.  Patient endorses good sleep and states that his sleep remains pretty solid.  On occasion, patient may take Aleve PM for pain which also helps him with sleep, however, patient reports that he sleeps better with the use of trazodone.  Patient endorses good appetite and has 3 definitive meals per day.  Patient denies alcohol use and illicit drug use.  Patient reports that it takes them roughly 3 days to finish a pack of cigarettes.  Visit Diagnosis:    ICD-10-CM   1. Anxiety state  F41.1 desvenlafaxine (PRISTIQ) 100 MG 24 hr tablet    gabapentin (NEURONTIN) 300 MG capsule  2. Depression, unspecified depression type  F32.A desvenlafaxine (PRISTIQ) 100 MG 24 hr tablet  3. Insomnia, unspecified type  G47.00 traZODone (  DESYREL) 50 MG tablet    Past Psychiatric History: Depression Anxiety Insomnia  Past Medical History:  Past Medical History:  Diagnosis Date  . Anxiety   . Back pain     Past Surgical History:  Procedure Laterality Date  . ANKLE SURGERY      Family Psychiatric History:  Unknown, but patient does  report that his mother was on anti-depressants in the past  Family History:  Family History  Problem Relation Age of Onset  . Kidney Stones Father   . Cancer Father 265       Prostate  . Cancer Maternal Grandmother        cervical  . Healthy Child        49 years old currently    Social History:  Social History   Socioeconomic History  . Marital status: Single    Spouse name: Not on file  . Number of children: 1  . Years of education: Not on file  . Highest education level: Not on file  Occupational History  . Occupation: Advertising account plannernsurance Agent  Tobacco Use  . Smoking status: Former Smoker    Quit date: 01/11/2000    Years since quitting: 20.2  . Smokeless tobacco: Never Used  . Tobacco comment: rare use when smoking -- < 1 pack per week  Substance and Sexual Activity  . Alcohol use: Yes    Alcohol/week: 0.0 standard drinks    Comment: daily   . Drug use: Yes    Frequency: 1.0 times per week    Types: Cocaine  . Sexual activity: Yes    Partners: Female  Other Topics Concern  . Not on file  Social History Narrative  . Not on file   Social Determinants of Health   Financial Resource Strain:   . Difficulty of Paying Living Expenses: Not on file  Food Insecurity:   . Worried About Programme researcher, broadcasting/film/videounning Out of Food in the Last Year: Not on file  . Ran Out of Food in the Last Year: Not on file  Transportation Needs:   . Lack of Transportation (Medical): Not on file  . Lack of Transportation (Non-Medical): Not on file  Physical Activity:   . Days of Exercise per Week: Not on file  . Minutes of Exercise per Session: Not on file  Stress:   . Feeling of Stress : Not on file  Social Connections:   . Frequency of Communication with Friends and Family: Not on file  . Frequency of Social Gatherings with Friends and Family: Not on file  . Attends Religious Services: Not on file  . Active Member of Clubs or Organizations: Not on file  . Attends BankerClub or Organization Meetings: Not on file  .  Marital Status: Not on file    Allergies: No Known Allergies  Metabolic Disorder Labs: No results found for: HGBA1C, MPG No results found for: PROLACTIN No results found for: CHOL, TRIG, HDL, CHOLHDL, VLDL, LDLCALC No results found for: TSH  Therapeutic Level Labs: No results found for: LITHIUM No results found for: VALPROATE No components found for:  CBMZ  Current Medications: Current Outpatient Medications  Medication Sig Dispense Refill  . acetaminophen (TYLENOL) 325 MG tablet Take 650 mg by mouth every 6 (six) hours as needed.    . desvenlafaxine (PRISTIQ) 100 MG 24 hr tablet Take 1 tablet (100 mg total) by mouth daily. 30 tablet 1  . diazepam (VALIUM) 5 MG tablet Take 1 tablet (5 mg total) by mouth every 12 (twelve)  hours as needed for anxiety. 30 tablet 1  . gabapentin (NEURONTIN) 300 MG capsule Take 1 capsule (300 mg total) by mouth 2 (two) times daily. Take 1 tablet (300 mg) by mouth on Day 1. Increase dosage by 300 mg by Day 2, and take 300 mg in the morning and 300 mg in the evening daily from then on. 60 capsule 1  . HYDROcodone-acetaminophen (NORCO) 10-325 MG per tablet Take 1 tablet by mouth every 8 (eight) hours as needed. 90 tablet 0  . hydrOXYzine (ATARAX/VISTARIL) 25 MG tablet Take 1 tablet (25 mg total) by mouth 3 (three) times daily as needed for anxiety. 90 tablet 1  . ibuprofen (ADVIL,MOTRIN) 200 MG tablet Take 200 mg by mouth every 6 (six) hours as needed for moderate pain.    . traZODone (DESYREL) 50 MG tablet Take 1 tablet (50 mg total) by mouth at bedtime as needed for sleep. 30 tablet 1   No current facility-administered medications for this visit.     Musculoskeletal: Strength & Muscle Tone: Unable to assess due to telemedicine visit Gait & Station: Unable to assess due to telemedicine visit Patient leans: Unable to assess due to telemedicine visit  Psychiatric Specialty Exam: Review of Systems  Psychiatric/Behavioral: Negative for decreased  concentration, dysphoric mood, hallucinations, sleep disturbance and suicidal ideas. The patient is nervous/anxious.     There were no vitals taken for this visit.There is no height or weight on file to calculate BMI.  General Appearance: Well Groomed  Eye Contact:  Good  Speech:  Clear and Coherent and Normal Rate  Volume:  Normal  Mood:  Anxious and Euthymic  Affect:  Appropriate and Congruent  Thought Process:  Coherent  Orientation:  Full (Time, Place, and Person)  Thought Content: WDL and Logical   Suicidal Thoughts:  No  Homicidal Thoughts:  No  Memory:  Immediate;   Good Recent;   Good Remote;   Good  Judgement:  Good  Insight:  Good  Psychomotor Activity:  Restlessness  Concentration:  Concentration: Good and Attention Span: Good  Recall:  Good  Fund of Knowledge: NA  Language: Good  Akathisia:  NA  Handed:  Right  AIMS (if indicated): not done  Assets:  Communication Skills Desire for Improvement Financial Resources/Insurance Physical Health Social Support  ADL's:  Intact  Cognition: WNL  Sleep:  Good   Screenings:   Assessment and Plan:  Dustin Blevins is a 49 year old male with a past psychiatric history significant for depression, anxiety, and insomnia who presents to [facility] via virtual video visit for follow-up and medication management.  Patient denies success with the management of his anxiety through the use of hydroxyzine.  At the time of this encounter, patient has been on the following medications for the management of his anxiety: clonidine, alprazolam, lorazepam, and hydroxyzine. Patient was recommended gabapentin for the management of his anxiety.  Patient to discontinue taking hydroxyzine and will be placed on gabapentin for the management of his anxiety.  Patient expresses that he would also like to eventually be on no medications and is requesting to titrate down on his dose of trazodone.  Patient  will continue taking trazodone 50 mg at bedtime for the management of insomnia. Patient's medications will be e-prescribed to pharmacy of choice.  1. Anxiety state  - gabapentin (NEURONTIN) 300 MG capsule; Take 1 capsule (300 mg total) by mouth 2 (two) times daily. Take 1 tablet (300 mg) by mouth on Day  1. Increase dosage by 300 mg by Day 2, and take 300 mg in the morning and 300 mg in the evening daily from then on. Dispense: 60 capsule; Refill: 1  - desvenlafaxine (PRISTIQ) 100 MG 24 hr tablet; Take 1 tablet (100 mg total) by mouth daily.  Dispense: 30 tablet; Refill: 2  2. Depression, unspecified depression type  - desvenlafaxine (PRISTIQ) 100 MG 24 hr tablet; Take 1 tablet (100 mg total) by mouth daily.  Dispense: 30 tablet; Refill: 2  3. Insomnia, unspecified type  - traZODone (DESYREL) 50 MG tablet; Take 1 tablet (50 mg total) by mouth at bedtime as needed for sleep. Dispense: 30 tablet; Refill: 1  Patient to follow-up in 6 weeks  Meta Hatchet, PA 04/21/2020, 3:34 AM

## 2020-05-24 ENCOUNTER — Other Ambulatory Visit: Payer: Self-pay

## 2020-05-24 ENCOUNTER — Telehealth (INDEPENDENT_AMBULATORY_CARE_PROVIDER_SITE_OTHER): Payer: No Payment, Other | Admitting: Physician Assistant

## 2020-05-24 DIAGNOSIS — G4700 Insomnia, unspecified: Secondary | ICD-10-CM | POA: Diagnosis not present

## 2020-05-24 DIAGNOSIS — F32A Depression, unspecified: Secondary | ICD-10-CM

## 2020-05-24 DIAGNOSIS — F411 Generalized anxiety disorder: Secondary | ICD-10-CM

## 2020-05-24 NOTE — Progress Notes (Addendum)
BH MD/PA/NP OP Progress Note  Virtual Visit via Telephone Note  I connected with Dustin Blevins on 05/24/2020 at  4:00 PM EST by telephone and verified that I am speaking with the correct person using two identifiers.  Location: Patient: Private location at work Provider: Clinic   I discussed the limitations, risks, security and privacy concerns of performing an evaluation and management service by telephone and the availability of in person appointments. I also discussed with the patient that there may be a patient responsible charge related to this service. The patient expressed understanding and agreed to proceed.  Follow Up Instructions:  I discussed the assessment and treatment plan with the patient. The patient was provided an opportunity to ask questions and all were answered. The patient agreed with the plan and demonstrated an understanding of the instructions.   The patient was advised to call back or seek an in-person evaluation if the symptoms worsen or if the condition fails to improve as anticipated.  I provided 25 minutes of non-face-to-face time during this encounter.  Meta Hatchet, PA   05/24/2020 8:56 PM [name]  MRN:  [id number]  Chief Complaint: Follow-up and medication management  HPI:  Dustin Blevins is a 49 year old male with a past psychiatric history significant for depression, anxiety, and insomnia who presents to [facility] via virtual video visit for follow-up and medication management.  Patient is currently being managed on the following medications:  Desvenlafaxine 100 mg 24-hour tablet daily Gabapentin 300 mg 2 times daily in the morning and at night Trazodone 50 mg at bedtime  Patient reports that he is still having issues with his anxiety.  Patient has not experienced a decrease in his anxiety level since being placed on gabapentin.  He reports that he had better control over his anxiety  when he was taking clonidine.  In the past, the only issues that the patient ran into while on clonidine were as followed: rebound anxiety the following day and drying out of the skin located on his elbows.  Patient is interested in being placed back on clonidine 0.1 mg 2 times daily.  Patient to discontinue taking gabapentin following the conclusion of the encounter.  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  Patient is currently undergoing training within the position but he states that it has been relatively easy.  Patient endorses that he has been tired lately and has not slept a lot but he attributes his fatigue to the recent acquisition of his new position.  Patient denies suicidal and homicidal ideations.  He further denies auditory or visual hallucinations.  Patient endorses 5 to 5-1/2 hours of sleep a night and will often wake up in the middle the night.  Patient endorses appetite and eats on average 3 meals a day.  Patient denies alcohol consumption and illicit drug use.  Patient reports that he is trying to wean himself off of tobacco use and has been smoking 5 to 10 cigarettes a day  Visit Diagnosis:    ICD-10-CM   1. Depression, unspecified depression type  F32.A   2. Anxiety state  F41.1   3. Insomnia, unspecified type  G47.00     Past Psychiatric History: Depression Anxiety Insomnia  Past Medical History:  Past Medical History:  Diagnosis Date  . Anxiety   . Back pain     Past Surgical History:  Procedure Laterality Date  .  ANKLE SURGERY      Family Psychiatric History:  Unknown, but patient does report that his mother was on anti-depressants in the past  Family History:  Family History  Problem Relation Age of Onset  . Kidney Stones Father   . Cancer Father 49       Prostate  . Cancer Maternal Grandmother        cervical  . Healthy Child        61 years old currently    Social History:  Social  History   Socioeconomic History  . Marital status: Single    Spouse name: Not on file  . Number of children: 1  . Years of education: Not on file  . Highest education level: Not on file  Occupational History  . Occupation: Advertising account planner  Tobacco Use  . Smoking status: Former Smoker    Quit date: 01/11/2000    Years since quitting: 20.3  . Smokeless tobacco: Never Used  . Tobacco comment: rare use when smoking -- < 1 pack per week  Substance and Sexual Activity  . Alcohol use: Yes    Alcohol/week: 0.0 standard drinks    Comment: daily   . Drug use: Yes    Frequency: 1.0 times per week    Types: Cocaine  . Sexual activity: Yes    Partners: Female  Other Topics Concern  . Not on file  Social History Narrative  . Not on file   Social Determinants of Health   Financial Resource Strain: Not on file  Food Insecurity: Not on file  Transportation Needs: Not on file  Physical Activity: Not on file  Stress: Not on file  Social Connections: Not on file    Allergies: No Known Allergies  Metabolic Disorder Labs: No results found for: HGBA1C, MPG No results found for: PROLACTIN No results found for: CHOL, TRIG, HDL, CHOLHDL, VLDL, LDLCALC No results found for: TSH  Therapeutic Level Labs: No results found for: LITHIUM No results found for: VALPROATE No components found for:  CBMZ  Current Medications: Current Outpatient Medications  Medication Sig Dispense Refill  . acetaminophen (TYLENOL) 325 MG tablet Take 650 mg by mouth every 6 (six) hours as needed.    . desvenlafaxine (PRISTIQ) 100 MG 24 hr tablet Take 1 tablet (100 mg total) by mouth daily. 30 tablet 1  . diazepam (VALIUM) 5 MG tablet Take 1 tablet (5 mg total) by mouth every 12 (twelve) hours as needed for anxiety. 30 tablet 1  . HYDROcodone-acetaminophen (NORCO) 10-325 MG per tablet Take 1 tablet by mouth every 8 (eight) hours as needed. 90 tablet 0  . hydrOXYzine (ATARAX/VISTARIL) 25 MG tablet Take 1 tablet (25  mg total) by mouth 3 (three) times daily as needed for anxiety. 90 tablet 1  . ibuprofen (ADVIL,MOTRIN) 200 MG tablet Take 200 mg by mouth every 6 (six) hours as needed for moderate pain.    . traZODone (DESYREL) 50 MG tablet Take 1 tablet (50 mg total) by mouth at bedtime as needed for sleep. 30 tablet 1   No current facility-administered medications for this visit.     Musculoskeletal: Strength & Muscle Tone: Unable to assess due to telemedicine visit Gait & Station: Unable to assess due to telemedicine visit Patient leans: Unable to assess due to telemedicine visit  Psychiatric Specialty Exam: Review of Systems  Psychiatric/Behavioral: Positive for sleep disturbance. Negative for decreased concentration, dysphoric mood, hallucinations and suicidal ideas. The patient is nervous/anxious. The patient is not hyperactive.  There were no vitals taken for this visit.There is no height or weight on file to calculate BMI.  General Appearance: Well Groomed  Eye Contact:  Good  Speech:  Clear and Coherent and Normal Rate  Volume:  Normal  Mood:  Anxious and Euthymic  Affect:  Appropriate and Congruent  Thought Process:  Coherent, Goal Directed and Descriptions of Associations: Intact  Orientation:  Full (Time, Place, and Person)  Thought Content: WDL and Logical   Suicidal Thoughts:  No  Homicidal Thoughts:  No  Memory:  Immediate;   Good Recent;   Good Remote;   Good  Judgement:  Good  Insight:  Good  Psychomotor Activity:  Normal  Concentration:  Concentration: Good and Attention Span: Good  Recall:  Good  Fund of Knowledge: Good  Language: Good  Akathisia:  NA  Handed:  Right  AIMS (if indicated): not done  Assets:  Communication Skills Desire for Improvement Financial Resources/Insurance Housing Social Support Vocational/Educational  ADL's:  Intact  Cognition: WNL  Sleep:  Fair   Screenings:   Assessment and Plan:  Dustin Blevins is a 49 year old male with  a past psychiatric history significant for depression, anxiety, and insomnia who presents to [facility] via virtual video visit for follow-up and medication management. Patient reports that he is still having issues with his anxiety.  Patient has not experienced a decrease in his anxiety level since being placed on gabapentin.  He reports that he had better control over his anxiety when he was taking clonidine. Patient is interested in being placed back on clonidine 0.1 mg 2 times daily.  Patient to discontinue taking gabapentin following the conclusion of the encounter. Patient's medications will be e-prescribed to pharmacy of choice.  1. Depression, unspecified depression type  - desvenlafaxine (PRISTIQ) 100 MG 24 hr tablet; Take 1 tablet (100 mg total) by mouth daily.  Dispense: 30 tablet; Refill: 1  2. Anxiety state  - desvenlafaxine (PRISTIQ) 100 MG 24 hr tablet; Take 1 tablet (100 mg total) by mouth daily.  Dispense: 30 tablet; Refill: 1 - cloNIDine (CATAPRES) 0.1 MG tablet; Take 1 tablet (0.1 mg total) by mouth 2 (two) times daily.  Dispense: 60 tablet; Refill: 0  3. Insomnia, unspecified type  - traZODone (DESYREL) 50 MG tablet; Take 1 tablet (50 mg total) by mouth at bedtime as needed for sleep.  Dispense: 30 tablet; Refill: 1  Patient to follow up in 6-weeks  Meta Hatchet, PA 05/24/2020, 8:56 PM

## 2020-06-27 ENCOUNTER — Encounter (HOSPITAL_COMMUNITY): Payer: Self-pay | Admitting: Physician Assistant

## 2020-06-27 ENCOUNTER — Other Ambulatory Visit: Payer: Self-pay

## 2020-06-27 ENCOUNTER — Telehealth (INDEPENDENT_AMBULATORY_CARE_PROVIDER_SITE_OTHER): Payer: No Payment, Other | Admitting: Physician Assistant

## 2020-06-27 DIAGNOSIS — G4700 Insomnia, unspecified: Secondary | ICD-10-CM

## 2020-06-27 DIAGNOSIS — F411 Generalized anxiety disorder: Secondary | ICD-10-CM | POA: Diagnosis not present

## 2020-06-27 DIAGNOSIS — F32A Depression, unspecified: Secondary | ICD-10-CM

## 2020-06-27 MED ORDER — CLONIDINE HCL 0.1 MG PO TABS: 0.1000 mg | ORAL_TABLET | Freq: Two times a day (BID) | ORAL | 0 refills | Status: AC

## 2020-06-27 MED ORDER — TRAZODONE HCL 50 MG PO TABS: 50.0000 mg | ORAL_TABLET | Freq: Every evening | ORAL | 1 refills | Status: DC | PRN

## 2020-06-27 MED ORDER — DESVENLAFAXINE SUCCINATE ER 100 MG PO TB24: 100.0000 mg | ORAL_TABLET | Freq: Every day | ORAL | 1 refills | Status: DC

## 2020-06-27 NOTE — Progress Notes (Signed)
BH MD/PA/NP OP Progress Note  Virtual Visit via Telephone Note  I connected with Dustin Blevins on 06/27/20 at  8:30 AM EST by telephone and verified that I am speaking with the correct person using two identifiers.  Location: Patient: Private location at work Provider: Clinic   I discussed the limitations, risks, security and privacy concerns of performing an evaluation and management service by telephone and the availability of in person appointments. I also discussed with the patient that there may be a patient responsible charge related to this service. The patient expressed understanding and agreed to proceed.  Follow Up Instructions:    I discussed the assessment and treatment plan with the patient. The patient was provided an opportunity to ask questions and all were answered. The patient agreed with the plan and demonstrated an understanding of the instructions.   The patient was advised to call back or seek an in-person evaluation if the symptoms worsen or if the condition fails to improve as anticipated.  I provided 24 minutes of non-face-to-face time during this encounter.   Meta Hatchet, PA   06/27/2020 5:06 PM Dustin Blevins  MRN:  [id number]  Chief Complaint: Follow-up and medication management  HPI:   Dustin Blevins is a 50 year old male with a past psychiatric history significant for depression, anxiety, and insomnia [facility]up and medication management.  Patient is currently being managed on the following medications:  Desvenlafaxine 100 mg 24-hour tablet daily Trazodone 50 mg at bedtime Clonidine 0.1 mg 2 times daily  Patient states that his current medication regimen is going well.  Patient states that he still has some anxiety before going to bed.  Patient is interested in adding lorazepam for the management of his anxiety.  Patient was informed that  lorazepam is within the class of benzodiazepines and is considered a controlled substance.  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  Patient was advised that if his anxiety was still not properly managed from his use of clonidine then writer may consider placing him on a benzodiazepine.  Patient was agreeable to plan.  Patient states that despite his anxiety, he is currently in a better place in life.  Patient is pleasant, calm, cooperative, and fully engaged in conversation during the encounter.  Patient states that he is excited about his new job and that his mood has been pretty good lately.  Patient notes his anxiety is 7 out of 10 and attributes his anxiety to stressful situations and moving to a new location.  Patient denies suicidal and homicidal ideations.  He further denies auditory or visual hallucinations.  Patient endorses fair sleep and receives on average 6 hours of sleep a night. Patient endorses good appetite and eats on average 3-4 meals a day.  Patient denies alcohol consumption and illicit drug use.  Patient endorses tobacco use off and on and reports that he smokes less than 2 cigarettes/day.  Visit Diagnosis:    ICD-10-CM   1. Depression, unspecified depression type  F32.A   2. Anxiety state  F41.1   3. Insomnia, unspecified type  G47.00     Past Psychiatric History: Depression Anxiety Insomnia  Past Medical History:  Past Medical History:  Diagnosis Date  . Anxiety   . Back pain     Past  Surgical History:  Procedure Laterality Date  . ANKLE SURGERY      Family Psychiatric History:  Unknown, but patient does report that his mother was on anti-depressants in the past  Family History:  Family History  Problem Relation Age of Onset  . Kidney Stones Father   . Cancer Father 101       Prostate  . Cancer Maternal Grandmother        cervical   . Healthy Child        47 years old currently    Social History:  Social History   Socioeconomic History  . Marital status: Single    Spouse name: Not on file  . Number of children: 1  . Years of education: Not on file  . Highest education level: Not on file  Occupational History  . Occupation: Advertising account planner  Tobacco Use  . Smoking status: Former Smoker    Quit date: 01/11/2000    Years since quitting: 20.4  . Smokeless tobacco: Never Used  . Tobacco comment: rare use when smoking -- < 1 pack per week  Substance and Sexual Activity  . Alcohol use: Yes    Alcohol/week: 0.0 standard drinks    Comment: daily   . Drug use: Yes    Frequency: 1.0 times per week    Types: Cocaine  . Sexual activity: Yes    Partners: Female  Other Topics Concern  . Not on file  Social History Narrative  . Not on file   Social Determinants of Health   Financial Resource Strain: Not on file  Food Insecurity: Not on file  Transportation Needs: Not on file  Physical Activity: Not on file  Stress: Not on file  Social Connections: Not on file    Allergies: No Known Allergies  Metabolic Disorder Labs: No results found for: HGBA1C, MPG No results found for: PROLACTIN No results found for: CHOL, TRIG, HDL, CHOLHDL, VLDL, LDLCALC No results found for: TSH  Therapeutic Level Labs: No results found for: LITHIUM No results found for: VALPROATE No components found for:  CBMZ  Current Medications: Current Outpatient Medications  Medication Sig Dispense Refill  . acetaminophen (TYLENOL) 325 MG tablet Take 650 mg by mouth every 6 (six) hours as needed.    . cloNIDine (CATAPRES) 0.1 MG tablet Take 1 tablet (0.1 mg total) by mouth 2 (two) times daily. 60 tablet 0  . desvenlafaxine (PRISTIQ) 100 MG 24 hr tablet Take 1 tablet (100 mg total) by mouth daily. 30 tablet 1  . diazepam (VALIUM) 5 MG tablet Take 1 tablet (5 mg total) by mouth every 12 (twelve) hours as needed for anxiety. 30 tablet 1   . HYDROcodone-acetaminophen (NORCO) 10-325 MG per tablet Take 1 tablet by mouth every 8 (eight) hours as needed. 90 tablet 0  . hydrOXYzine (ATARAX/VISTARIL) 25 MG tablet Take 1 tablet (25 mg total) by mouth 3 (three) times daily as needed for anxiety. 90 tablet 1  . ibuprofen (ADVIL,MOTRIN) 200 MG tablet Take 200 mg by mouth every 6 (six) hours as needed for moderate pain.    . traZODone (DESYREL) 50 MG tablet Take 1 tablet (50 mg total) by mouth at bedtime as needed for sleep. 30 tablet 1   No current facility-administered medications for this visit.     Musculoskeletal: Strength & Muscle Tone: Unable to assess due to telemedicine visit Gait & Station: Unable to assess due to telemedicine visit Patient leans: Unable to assess due to telemedicine visit  Psychiatric  Specialty Exam: Review of Systems  Psychiatric/Behavioral: Negative for decreased concentration, dysphoric mood, hallucinations, sleep disturbance and suicidal ideas. The patient is nervous/anxious. The patient is not hyperactive.     There were no vitals taken for this visit.There is no height or weight on file to calculate BMI.  General Appearance: Unable to assess due to telemedicine visit  Eye Contact:  Unable to assess due to telemedicine visit  Speech:  Clear and Coherent and Normal Rate  Volume:  Normal  Mood:  Anxious and Euthymic  Affect:  Appropriate and Congruent  Thought Process:  Coherent, Goal Directed and Descriptions of Associations: Intact  Orientation:  Full (Time, Place, and Person)  Thought Content: WDL and Logical   Suicidal Thoughts:  No  Homicidal Thoughts:  No  Memory:  Immediate;   Good Recent;   Good Remote;   Good  Judgement:  Good  Insight:  Good  Psychomotor Activity:  Normal  Concentration:  Concentration: Good and Attention Span: Good  Recall:  Good  Fund of Knowledge: Good  Language: Good  Akathisia:  NA  Handed:  Right  AIMS (if indicated): not done  Assets:  Communication  Skills Desire for Improvement Financial Resources/Insurance Housing Social Support Vocational/Educational  ADL's:  Intact  Cognition: WNL  Sleep:  Fair   Screenings:   Assessment and Plan:   Dustin Blevins is a 50 year old male with a past psychiatric history significant for depression, anxiety, and insomnia [facility] follow-up and medication management. Patient states that he still has some anxiety before going to bed.  Patient is interested in adding lorazepam for the management of his anxiety. 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  Patient was advised that if his anxiety was still not properly managed from his use of clonidine then writer may consider placing him on a benzodiazepine.  Patient is agreeable to plan.  1. Depression, unspecified depression type Patient to continue taking desvenlafaxine for the management of his depression  2. Anxiety state Patient to continue taking desvenlafaxine for the management of his anxiety Patient to continue taking clonidine 0.1 mg for the management of his anxiety  3. Insomnia, unspecified type Patient to continue taking trazodone 50 mg at bedtime for the management of his insomnia  Patient to follow up in 4 weeks  Meta Hatchet, PA 06/27/2020, 5:06 PM

## 2020-08-08 ENCOUNTER — Other Ambulatory Visit: Payer: Self-pay

## 2020-08-08 ENCOUNTER — Encounter (HOSPITAL_COMMUNITY): Payer: Self-pay | Admitting: Physician Assistant

## 2020-08-08 ENCOUNTER — Telehealth (INDEPENDENT_AMBULATORY_CARE_PROVIDER_SITE_OTHER): Payer: No Payment, Other | Admitting: Physician Assistant

## 2020-08-08 DIAGNOSIS — F330 Major depressive disorder, recurrent, mild: Secondary | ICD-10-CM | POA: Diagnosis not present

## 2020-08-08 DIAGNOSIS — F411 Generalized anxiety disorder: Secondary | ICD-10-CM | POA: Diagnosis not present

## 2020-08-08 DIAGNOSIS — G4700 Insomnia, unspecified: Secondary | ICD-10-CM | POA: Diagnosis not present

## 2020-08-08 MED ORDER — TRAZODONE HCL 50 MG PO TABS: 50.0000 mg | ORAL_TABLET | Freq: Every evening | ORAL | 1 refills | Status: DC | PRN

## 2020-08-08 MED ORDER — DESVENLAFAXINE SUCCINATE ER 100 MG PO TB24: 100.0000 mg | ORAL_TABLET | Freq: Every day | ORAL | 2 refills | Status: DC

## 2020-08-08 NOTE — Progress Notes (Signed)
BH MD/PA/NP OP Progress Note  Virtual Visit via Video Note  I connected with Dustin Blevins on 08/08/20 at  8:30 AM EST by a video enabled telemedicine application and verified that I am speaking with the correct person using two identifiers.  Location: Patient: Home Provider: Clinic   I discussed the limitations of evaluation and management by telemedicine and the availability of in person appointments. The patient expressed understanding and agreed to proceed.  Follow Up Instructions   I discussed the assessment and treatment plan with the patient. The patient was provided an opportunity to ask questions and all were answered. The patient agreed with the plan and demonstrated an understanding of the instructions.   The patient was advised to call back or seek an in-person evaluation if the symptoms worsen or if the condition fails to improve as anticipated.  I provided 19 minutes of non-face-to-face time during this encounter.  Meta Hatchet, PA   08/08/2020 10:53 AM Dustin Blevins  MRN:  [id number]  Chief Complaint: Follow up and medication management  HPI:   Dustin Blevins is a 50 year old male with a past psychiatric history significant for depression, anxiety, and insomnia who presents to [facility] via virtual video visit for follow-up and medication management.  Patient is currently being managed on the following medications:  Desvenlafaxine 100 mg 24-hour tablet daily Trazodone 50 mg at bedtime Clonidine 0.1 mg 2 times daily as needed  Patient reports that he seems to be doing okay. He reports that he still struggles with his anxiety on occasion.  Patient states that his anxiety comes and goes, however, whenever his anxiety does present, it interferes with his work and makes it harder for him to sleep.  Patient expresses that there are times where he is overly worried about things that don't make sense. Patient reports  that his clonidine has not been helpful in the management of his anxiety and he is still experiencing breaking out of his elbows.  A GAD-7 screen was performed today with the patient scoring a 14.  Patient is pleasant, calm, cooperative, and fully engaged in conversation during the encounter.  Despite managing his anxiety, patient reports that he is in a pretty good mood.  Patient denies suicidal or homicidal ideations.  He further denies auditory or visual hallucinations.  Patient endorses good sleep and receives on average 7 hours of sleep each night.  Patient reports that he will occasionally take trazodone 50 mg for the management of his sleep.  Patient endorses good appetite and eats on average 3 meals per day.  Patient denies alcohol consumption and illicit drug use.  Patient further denies tobacco use and states that he recently quit a week ago.  Visit Diagnosis:    ICD-10-CM   1. Depression, unspecified depression type  F32.A   2. Generalized anxiety disorder  F41.1   3. Insomnia, unspecified type  G47.00     Past Psychiatric History:  Depression Anxiety Insomnia  Past Medical History:  Past Medical History:  Diagnosis Date  . Anxiety   . Back pain     Past Surgical History:  Procedure Laterality Date  . ANKLE SURGERY      Family Psychiatric History:  Unknown, but patient does report that his mother was on anti-depressants in the past  Family History:  Family History  Problem Relation Age of Onset  . Kidney Stones Father   . Cancer Father 87  Prostate  . Cancer Maternal Grandmother        cervical  . Healthy Child        67 years old currently    Social History:  Social History   Socioeconomic History  . Marital status: Single    Spouse name: Not on file  . Number of children: 1  . Years of education: Not on file  . Highest education level: Not on file  Occupational History  . Occupation: Advertising account planner  Tobacco Use  . Smoking status: Former Smoker     Quit date: 01/11/2000    Years since quitting: 20.5  . Smokeless tobacco: Never Used  . Tobacco comment: rare use when smoking -- < 1 pack per week  Substance and Sexual Activity  . Alcohol use: Yes    Alcohol/week: 0.0 standard drinks    Comment: daily   . Drug use: Yes    Frequency: 1.0 times per week    Types: Cocaine  . Sexual activity: Yes    Partners: Female  Other Topics Concern  . Not on file  Social History Narrative  . Not on file   Social Determinants of Health   Financial Resource Strain: Not on file  Food Insecurity: Not on file  Transportation Needs: Not on file  Physical Activity: Not on file  Stress: Not on file  Social Connections: Not on file    Allergies: No Known Allergies  Metabolic Disorder Labs: No results found for: HGBA1C, MPG No results found for: PROLACTIN No results found for: CHOL, TRIG, HDL, CHOLHDL, VLDL, LDLCALC No results found for: TSH  Therapeutic Level Labs: No results found for: LITHIUM No results found for: VALPROATE No components found for:  CBMZ  Current Medications: Current Outpatient Medications  Medication Sig Dispense Refill  . acetaminophen (TYLENOL) 325 MG tablet Take 650 mg by mouth every 6 (six) hours as needed.    . cloNIDine (CATAPRES) 0.1 MG tablet Take 1 tablet (0.1 mg total) by mouth 2 (two) times daily. 60 tablet 0  . desvenlafaxine (PRISTIQ) 100 MG 24 hr tablet Take 1 tablet (100 mg total) by mouth daily. 30 tablet 1  . diazepam (VALIUM) 5 MG tablet Take 1 tablet (5 mg total) by mouth every 12 (twelve) hours as needed for anxiety. 30 tablet 1  . HYDROcodone-acetaminophen (NORCO) 10-325 MG per tablet Take 1 tablet by mouth every 8 (eight) hours as needed. 90 tablet 0  . hydrOXYzine (ATARAX/VISTARIL) 25 MG tablet Take 1 tablet (25 mg total) by mouth 3 (three) times daily as needed for anxiety. 90 tablet 1  . ibuprofen (ADVIL,MOTRIN) 200 MG tablet Take 200 mg by mouth every 6 (six) hours as needed for moderate  pain.    . traZODone (DESYREL) 50 MG tablet Take 1 tablet (50 mg total) by mouth at bedtime as needed for sleep. 30 tablet 1   No current facility-administered medications for this visit.     Musculoskeletal: Strength & Muscle Tone: Unable to assess due to telemedicine visit Gait & Station: Unable to assess due to telemedicine visit Patient leans: Unable to assess due to telemedicine visit  Psychiatric Specialty Exam: Review of Systems  Psychiatric/Behavioral: Negative for agitation, decreased concentration, dysphoric mood, hallucinations, self-injury, sleep disturbance and suicidal ideas. The patient is nervous/anxious. The patient is not hyperactive.     There were no vitals taken for this visit.There is no height or weight on file to calculate BMI.  General Appearance: Well Groomed  Eye Contact:  Good  Speech:  Clear and Coherent and Normal Rate  Volume:  Normal  Mood:  Anxious and Euthymic  Affect:  Appropriate and Congruent  Thought Process:  Coherent and Descriptions of Associations: Intact  Orientation:  Full (Time, Place, and Person)  Thought Content: WDL and Logical   Suicidal Thoughts:  No  Homicidal Thoughts:  No  Memory:  Immediate;   Good Recent;   Good Remote;   Good  Judgement:  Good  Insight:  Good  Psychomotor Activity:  Normal  Concentration:  Concentration: Good and Attention Span: Good  Recall:  Good  Fund of Knowledge: Good  Language: Good  Akathisia:  NA  Handed:  Right  AIMS (if indicated): not done  Assets:  Communication Skills Desire for Improvement Financial Resources/Insurance Housing Social Support Vocational/Educational  ADL's:  Intact  Cognition: WNL  Sleep:  Good   Screenings: GAD-7   Flowsheet Row Video Visit from 08/08/2020 in [facility]  Total GAD-7 Score 14    PHQ2-9   Flowsheet Row Video Visit from 08/08/2020 in [place]  PHQ-2 Total Score 1    Flowsheet Row Video  [facility] 08/08/[facility] in [facility]  C-SSRS RISK CATEGORY Low Risk       Assessment and Plan:   Dustin Blevins is a 50 year old male with a past psychiatric history significant for depression, anxiety, and insomnia who presents to [facility] via virtual video visit for follow-up and medication management.  Patient reports that he is still having issues with his anxiety that is not alleviated through the use of clonidine.  Patient was recommended taking a low-dose of Seroquel (25 mg) for the management of his daytime anxiety.  Patient is not agreeable to taking Seroquel stating that he had been on Seroquel in the past and did not like the way it made him feel.  Patient would like to discontinue taking clonidine 0.1 mg 2 times daily but is comfortable with continuing to take desvenlafaxine 100 mg tablets daily and trazodone 50 mg at bedtime as needed.  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  Dispense: 30 tablet; Refill: 2  2. Generalized anxiety disorder  - desvenlafaxine (PRISTIQ) 100 MG 24 hr tablet; Take 1 tablet (100 mg total) by mouth daily.  Dispense: 30 tablet; Refill: 2  3. Insomnia, unspecified type  - traZODone (DESYREL) 50 MG tablet; Take 1 tablet (50 mg total) by mouth at bedtime as needed for sleep.  Dispense: 30 tablet; Refill: 1  Patient to follow-up in 55-months  Meta Hatchet, PA 08/08/2020, 10:53 AM

## 2020-10-03 ENCOUNTER — Telehealth (HOSPITAL_COMMUNITY): Payer: No Payment, Other | Admitting: Physician Assistant

## 2020-10-19 ENCOUNTER — Telehealth (INDEPENDENT_AMBULATORY_CARE_PROVIDER_SITE_OTHER): Payer: No Payment, Other | Admitting: Physician Assistant

## 2020-10-19 ENCOUNTER — Other Ambulatory Visit: Payer: Self-pay

## 2020-10-19 ENCOUNTER — Encounter (HOSPITAL_COMMUNITY): Payer: Self-pay | Admitting: Physician Assistant

## 2020-10-19 DIAGNOSIS — F411 Generalized anxiety disorder: Secondary | ICD-10-CM

## 2020-10-19 DIAGNOSIS — F330 Major depressive disorder, recurrent, mild: Secondary | ICD-10-CM

## 2020-10-19 DIAGNOSIS — G4700 Insomnia, unspecified: Secondary | ICD-10-CM | POA: Diagnosis not present

## 2020-10-19 NOTE — Progress Notes (Signed)
BH MD/PA/NP OP Progress Note  Virtual Visit via Telephone Note  I connected with Dustin Blevins on 10/19/20 at  4:00 PM EDT by telephone and verified that I am speaking with the correct person using two identifiers.  Location: Patient: Home Provider: Clinic   I discussed the limitations, risks, security and privacy concerns of performing an evaluation and management service by telephone and the availability of in person appointments. I also discussed with the patient that there may be a patient responsible charge related to this service. The patient expressed understanding and agreed to proceed.  Follow Up Instructions:  I discussed the assessment and treatment plan with the patient. The patient was provided an opportunity to ask questions and all were answered. The patient agreed with the plan and demonstrated an understanding of the instructions.   The patient was advised to call back or seek an in-person evaluation if the symptoms worsen or if the condition fails to improve as anticipated.  I provided 23 minutes of non-face-to-face time during this encounter.  Meta Hatchet, PA   10/19/2020 8:38 PM Dustin Blevins  MRN:  [id number]  Chief Complaint: Follow-up and medication management  HPI:   Dustin Blevins is a 50 year old male with a past psychiatric history significant for major depressive disorder, generalized anxiety disorder, and insomnia who presents to [facility] via virtual telephone visit for follow-up and medication management.  Patient is currently being managed on the following medications:  Desvenlafaxine 100 mg daily Trazodone 50 mg at bedtime  Patient reports that he is still having issues with his anxiety.  Patient states that his anxiety is often through the roof and it has been disrupting his sleep.  Patient also expresses having a few health related issues recently.  Patient states that he has been  experiencing episodes of vomiting up blood.  Patient also expresses that he has been experiencing generalized pain all over his body especially in the joints of his elbows.  Patient also reports that the pain he is experiences contributes to his inability to be able to fall asleep.  Patient endorses instances of irritability often becoming short towards people when interacting with them.  A GAD-7 screen was performed with the patient scoring a 16.  Patient is calm, cooperative, and fully engaged in conversation during the encounter.  Patient endorses worsening anxiety but states that overall, he is doing well.  Patient denies suicidal or homicidal ideations.  He further denies auditory or visual hallucinations and does not appear to be responding to internal/external stimuli.  Patient endorses poor sleep and receives around 3 to 4 hours of sleep each night even with the use of trazodone.  Patient states that he is able to eat but endorses a lack of appetite.  Patient states that he eats on average 1 meal per day.  Patient denies alcohol consumption, tobacco use, and illicit drug use.   Visit Diagnosis:    ICD-10-CM   1. Generalized anxiety disorder  F41.1   2. Mild episode of recurrent major depressive disorder (HCC)  F33.0   3. Insomnia, unspecified type  G47.00     Past Psychiatric History:  Major depressive disorder Generalized anxiety disorder Insomnia  Past Medical History:  Past Medical History:  Diagnosis Date  . Anxiety   . Back pain     Past Surgical History:  Procedure Laterality Date  . ANKLE SURGERY      Family Psychiatric History:  Unknown, but patient does  report that his mother was on anti-depressants in the past  Family History:  Family History  Problem Relation Age of Onset  . Kidney Stones Father   . Cancer Father 47       Prostate  . Cancer Maternal Grandmother        cervical  . Healthy Child        21 years old currently    Social History:  Social  History   Socioeconomic History  . Marital status: Single    Spouse name: Not on file  . Number of children: 1  . Years of education: Not on file  . Highest education level: Not on file  Occupational History  . Occupation: Advertising account planner  Tobacco Use  . Smoking status: Former Smoker    Quit date: 01/11/2000    Years since quitting: 20.7  . Smokeless tobacco: Never Used  . Tobacco comment: rare use when smoking -- < 1 pack per week  Substance and Sexual Activity  . Alcohol use: Yes    Alcohol/week: 0.0 standard drinks    Comment: daily   . Drug use: Yes    Frequency: 1.0 times per week    Types: Cocaine  . Sexual activity: Yes    Partners: Female  Other Topics Concern  . Not on file  Social History Narrative  . Not on file   Social Determinants of Health   Financial Resource Strain: Not on file  Food Insecurity: Not on file  Transportation Needs: Not on file  Physical Activity: Not on file  Stress: Not on file  Social Connections: Not on file    Allergies: No Known Allergies  Metabolic Disorder Labs: No results found for: HGBA1C, MPG No results found for: PROLACTIN No results found for: CHOL, TRIG, HDL, CHOLHDL, VLDL, LDLCALC No results found for: TSH  Therapeutic Level Labs: No results found for: LITHIUM No results found for: VALPROATE No components found for:  CBMZ  Current Medications: Current Outpatient Medications  Medication Sig Dispense Refill  . acetaminophen (TYLENOL) 325 MG tablet Take 650 mg by mouth every 6 (six) hours as needed.    . cloNIDine (CATAPRES) 0.1 MG tablet Take 1 tablet (0.1 mg total) by mouth 2 (two) times daily. 60 tablet 0  . desvenlafaxine (PRISTIQ) 100 MG 24 hr tablet Take 1 tablet (100 mg total) by mouth daily. 30 tablet 2  . diazepam (VALIUM) 5 MG tablet Take 1 tablet (5 mg total) by mouth every 12 (twelve) hours as needed for anxiety. 30 tablet 1  . HYDROcodone-acetaminophen (NORCO) 10-325 MG per tablet Take 1 tablet by mouth  every 8 (eight) hours as needed. 90 tablet 0  . hydrOXYzine (ATARAX/VISTARIL) 25 MG tablet Take 1 tablet (25 mg total) by mouth 3 (three) times daily as needed for anxiety. 90 tablet 1  . ibuprofen (ADVIL,MOTRIN) 200 MG tablet Take 200 mg by mouth every 6 (six) hours as needed for moderate pain.    . traZODone (DESYREL) 50 MG tablet Take 1 tablet (50 mg total) by mouth at bedtime as needed for sleep. 30 tablet 1   No current facility-administered medications for this visit.     Musculoskeletal: Strength & Muscle Tone: Unable to assess due to telemedicine visit Gait & Station: Unable to assess due to telemedicine visit Patient leans: Unable to assess due to telemedicine visit  Psychiatric Specialty Exam: Review of Systems  Psychiatric/Behavioral: Positive for agitation and sleep disturbance. Negative for decreased concentration, dysphoric mood, hallucinations, self-injury and suicidal  ideas. The patient is nervous/anxious. The patient is not hyperactive.     There were no vitals taken for this visit.There is no height or weight on file to calculate BMI.  General Appearance: Unable to assess due to telemedicine visit  Eye Contact:  Unable to assess due to telemedicine visit  Speech:  Clear and Coherent and Normal Rate  Volume:  Normal  Mood:  Anxious and Irritable  Affect:  Congruent and Depressed  Thought Process:  Coherent, Goal Directed and Descriptions of Associations: Intact  Orientation:  Full (Time, Place, and Person)  Thought Content: WDL   Suicidal Thoughts:  No  Homicidal Thoughts:  No  Memory:  Immediate;   Good Recent;   Good Remote;   Good  Judgement:  Good  Insight:  Good  Psychomotor Activity:  Normal  Concentration:  Concentration: Good and Attention Span: Good  Recall:  Good  Fund of Knowledge: Good  Language: Good  Akathisia:  NA  Handed:  Right  AIMS (if indicated): not done  Assets:  Communication Skills Desire for Improvement Financial  Resources/Insurance Housing Social Support Vocational/Educational  ADL's:  Intact  Cognition: WNL  Sleep:  Fair   Screenings: GAD-7   Flowsheet Row Video Visit from 10/19/2020 in [facility] Video Visit from 08/08/2020 in [facility]  Total GAD-7 Score 16 14    PHQ2-9   Flowsheet Row Video Visit from 10/19/2020 in [facility] Video Visit from 08/08/2020 in [facility]  PHQ-2 Total Score 1 1    Flowsheet Row Video Visit from 10/19/2020 in [facility] Video Visit from 08/08/2020 in [facility]  C-SSRS RISK CATEGORY Low Risk Low Risk       Assessment and Plan:   Littleton Haub is a 50 year old male with a past psychiatric history significant for major depressive disorder, generalized anxiety disorder, and insomnia who presents to [facility] via virtual telephone visit for follow-up and medication management.  Patient endorses worsening anxiety and states that his anxiety is through the roof.  Patient reports that it is often difficult for him to go to sleep due to his anxiety.  Patient has also been dealing with some health related problems recently.  Patient states that he will be going to a gastroenterologist for the assessment of vomiting blood.  Patient has tried a variety of anxiolytic medications without any relief.  Provider to consider starting the patient on a short term course of benzodiazepine in hopes of managing the patient's anxiety.  Patient denies the need for medication refills at this time.  Patient to continue taking his current prescriptions as prescribed.  1. Generalized anxiety disorder Patient to continue taking desvenlafaxine 100 mg daily for the management of a generalized anxiety disorder  2. Mild episode of recurrent major depressive disorder (HCC) Patient  to continue taking desvenlafaxine 100 mg daily for the management of his major depressive disorder  3. Insomnia, unspecified type Patient to continue taking trazodone 50 mg at bedtime for the management of his insomnia  Patient to follow-up in 2 months  Meta Hatchet, PA 10/19/2020, 8:38 PM

## 2020-11-03 ENCOUNTER — Telehealth (HOSPITAL_COMMUNITY): Payer: Self-pay

## 2020-11-03 NOTE — Telephone Encounter (Signed)
Patient called regarding the Benzodiazepine he and you talked about starting him on at his last visit for his anxiety. He stated that it's been a couple weeks & he doesn't have anything. If that's the correct medication, could you send it in to [facility] on [address]/[place]. Please review and advise. Thank you

## 2020-11-04 NOTE — Telephone Encounter (Signed)
Provider was contacted by Alinda Money, CMA regarding patient's anxiety. Provider had discussed with patient anti-anxiolytic options. Will contact patient regarding concerns and discuss treatment plan.

## 2020-11-05 ENCOUNTER — Other Ambulatory Visit (HOSPITAL_COMMUNITY): Payer: Self-pay | Admitting: Physician Assistant

## 2020-11-05 DIAGNOSIS — F411 Generalized anxiety disorder: Secondary | ICD-10-CM

## 2020-11-05 MED ORDER — CLONAZEPAM 0.5 MG PO TABS: 0.5000 mg | ORAL_TABLET | Freq: Two times a day (BID) | ORAL | 0 refills | Status: DC

## 2020-11-05 NOTE — Progress Notes (Signed)
Provider discussed with patient medication management regarding his worsening anxiety. Provider recommended Klonopin 0.5 mg 2 times daily as needed for the management of his anxiety. Patient was agreeable to recommendation. Patient's medication to be e-prescribed to pharmacy of choice.

## 2020-11-13 ENCOUNTER — Telehealth (HOSPITAL_COMMUNITY): Payer: Self-pay | Admitting: *Deleted

## 2020-11-13 NOTE — Telephone Encounter (Signed)
Rx Refill desvenlafaxine (PRISTIQ) 100 MG 24 hr tablet

## 2020-11-14 ENCOUNTER — Other Ambulatory Visit (HOSPITAL_COMMUNITY): Payer: Self-pay | Admitting: Physician Assistant

## 2020-11-14 DIAGNOSIS — F330 Major depressive disorder, recurrent, mild: Secondary | ICD-10-CM

## 2020-11-14 DIAGNOSIS — F411 Generalized anxiety disorder: Secondary | ICD-10-CM

## 2020-11-14 MED ORDER — DESVENLAFAXINE SUCCINATE ER 100 MG PO TB24: 100.0000 mg | ORAL_TABLET | Freq: Every day | ORAL | 2 refills | Status: DC

## 2020-11-14 NOTE — Telephone Encounter (Signed)
Medication resent and sent to preferred pharmacy.

## 2020-12-05 ENCOUNTER — Telehealth (HOSPITAL_COMMUNITY): Payer: Self-pay | Admitting: *Deleted

## 2020-12-05 ENCOUNTER — Other Ambulatory Visit (HOSPITAL_COMMUNITY): Payer: Self-pay | Admitting: Physician Assistant

## 2020-12-05 DIAGNOSIS — F411 Generalized anxiety disorder: Secondary | ICD-10-CM

## 2020-12-05 MED ORDER — CLONAZEPAM 0.5 MG PO TABS: 0.5000 mg | ORAL_TABLET | Freq: Two times a day (BID) | ORAL | 0 refills | Status: DC

## 2020-12-05 NOTE — Telephone Encounter (Signed)
Patient needs refill of Klonopin.  Next appt 12/21/20.

## 2020-12-05 NOTE — Telephone Encounter (Signed)
Patient's medication has been e-prescribed to pharmacy of choice.

## 2020-12-05 NOTE — Telephone Encounter (Signed)
Patient needs refill of Klonopin sent to [facility] in [place].

## 2020-12-05 NOTE — Telephone Encounter (Signed)
Patient nee

## 2020-12-05 NOTE — Telephone Encounter (Signed)
Provider was contacted by Kimberly J. Maggio RN regarding medication refill. Patient's medication to be e-prescribed to pharmacy of choice. 

## 2020-12-05 NOTE — Progress Notes (Signed)
Provider was contacted by Loren Racer RN regarding medication refill. Patient's medication to be e-prescribed to pharmacy of choice.

## 2020-12-21 ENCOUNTER — Encounter (HOSPITAL_COMMUNITY): Payer: Self-pay | Admitting: Physician Assistant

## 2020-12-21 ENCOUNTER — Telehealth (INDEPENDENT_AMBULATORY_CARE_PROVIDER_SITE_OTHER): Payer: No Payment, Other | Admitting: Physician Assistant

## 2020-12-21 ENCOUNTER — Other Ambulatory Visit: Payer: Self-pay

## 2020-12-21 DIAGNOSIS — G4700 Insomnia, unspecified: Secondary | ICD-10-CM

## 2020-12-21 DIAGNOSIS — F330 Major depressive disorder, recurrent, mild: Secondary | ICD-10-CM

## 2020-12-21 DIAGNOSIS — F411 Generalized anxiety disorder: Secondary | ICD-10-CM

## 2020-12-21 MED ORDER — DESVENLAFAXINE SUCCINATE ER 100 MG PO TB24: 100.0000 mg | ORAL_TABLET | Freq: Every day | ORAL | 2 refills | Status: DC

## 2020-12-21 MED ORDER — CLONAZEPAM 0.5 MG PO TABS: 0.5000 mg | ORAL_TABLET | Freq: Two times a day (BID) | ORAL | 0 refills | Status: DC

## 2020-12-21 MED ORDER — TRAZODONE HCL 50 MG PO TABS: 50.0000 mg | ORAL_TABLET | Freq: Every evening | ORAL | 1 refills | Status: DC | PRN

## 2020-12-21 NOTE — Progress Notes (Signed)
BH MD/PA/NP OP Progress Note  Virtual Visit via Telephone Note  I connected with Dustin Blevins on 12/21/20 at  4:00 PM EDT by telephone and verified that I am speaking with the correct person using two identifiers.  Location: Patient: Home Provider: Clinic   I discussed the limitations, risks, security and privacy concerns of performing an evaluation and management service by telephone and the availability of in person appointments. I also discussed with the patient that there may be a patient responsible charge related to this service. The patient expressed understanding and agreed to proceed.  Follow Up Instructions:   I discussed the assessment and treatment plan with the patient. The patient was provided an opportunity to ask questions and all were answered. The patient agreed with the plan and demonstrated an understanding of the instructions.   The patient was advised to call back or seek an in-person evaluation if the symptoms worsen or if the condition fails to improve as anticipated.  I provided 25 minutes of non-face-to-face time during this encounter.  Meta Hatchet, PA    12/21/2020 9:52 PM [name]  MRN:  [id number]  Chief Complaint: Follow up and medication management  HPI:   Dustin Blevins is a 50 year old male with a past psychiatric history significant for Dustin depressive disorder, generalized anxiety disorder, and insomnia who presents to [facility] via virtual telephone visit for follow-up and medication management.  Patient is currently being managed on the following medications:  Clonazepam 0.5 mg 2 times daily as needed Pristiq 100 mg 24-hour tablet daily Trazodone 50 mg at bedtime  Patient reports that his clonazepam has been helpful with his anxiety.  He expresses that he still occasionally feels the heaviness sensation associated with his anxiety but it has occurred less often.  He also reports  decreased instances of difficulty breathing and the sensation of an elephant standing on his chest.  Patient denies depressive symptoms but states that he was a little down this past month due to having to get a new job.  Patient reports that he had to leave his previous place of employ due to the shady practices they were engaging in.  A GAD-7 screen was performed with the patient scoring a 17.  Patient is alert and oriented x4, pleasant, calm, cooperative, and fully engaged in conversation during the encounter.  Patient reports that he is in a very good mood.  Patient denies suicidal or homicidal ideations.  He further denies auditory or visual hallucinations and does not appear to be responding to internal/external stimuli.  Patient states that he has been experiencing ringing in his left ear that is getting worse.  Patient endorses fair sleep and receives on average 5 to 6 hours of sleep a night.  Patient states that he has had to resort to taking up to 100 mg of trazodone in order to sleep.  Patient endorses increased appetite and eats on average 3 meals per day.  Patient denies alcohol consumption and illicit drug use.  Patient endorses tobacco use sparingly and smokes on average 1 cigarette/day.  Visit Diagnosis:    ICD-10-CM   1. Mild episode of recurrent Dustin depressive disorder (HCC)  F33.0 desvenlafaxine (PRISTIQ) 100 MG 24 hr tablet    2. Generalized anxiety disorder  F41.1 desvenlafaxine (PRISTIQ) 100 MG 24 hr tablet    clonazePAM (KLONOPIN) 0.5 MG tablet    DISCONTINUED: clonazePAM (KLONOPIN) 0.5 MG tablet    3. Insomnia, unspecified type  G47.00 traZODone (DESYREL) 50 MG tablet      Past Psychiatric History:  Dustin depressive disorder Generalized anxiety disorder Insomnia  Past Medical History:  Past Medical History:  Diagnosis Date   Anxiety    Back pain     Past Surgical History:  Procedure Laterality Date   ANKLE SURGERY      Family Psychiatric History:  Unknown,  but patient does report that his mother was on anti-depressants in the past  Family History:  Family History  Problem Relation Age of Onset   Kidney Stones Father    Cancer Father 66       Prostate   Cancer Maternal Grandmother        cervical   Healthy Child        39 years old currently    Social History:  Social History   Socioeconomic History   Marital status: Single    Spouse name: Not on file   Number of children: 1   Years of education: Not on file   Highest education level: Not on file  Occupational History   Occupation: Advertising account planner  Tobacco Use   Smoking status: Former    Types: Cigarettes    Quit date: 01/11/2000    Years since quitting: 20.9   Smokeless tobacco: Never   Tobacco comments:    rare use when smoking -- < 1 pack per week  Substance and Sexual Activity   Alcohol use: Yes    Alcohol/week: 0.0 standard drinks    Comment: daily    Drug use: Yes    Frequency: 1.0 times per week    Types: Cocaine   Sexual activity: Yes    Partners: Female  Other Topics Concern   Not on file  Social History Narrative   Not on file   Social Determinants of Health   Financial Resource Strain: Not on file  Food Insecurity: Not on file  Transportation Needs: Not on file  Physical Activity: Not on file  Stress: Not on file  Social Connections: Not on file    Allergies: Not on File  Metabolic Disorder Labs: No results found for: HGBA1C, MPG No results found for: PROLACTIN No results found for: CHOL, TRIG, HDL, CHOLHDL, VLDL, LDLCALC No results found for: TSH  Therapeutic Level Labs: No results found for: LITHIUM No results found for: VALPROATE No components found for:  CBMZ  Current Medications: Current Outpatient Medications  Medication Sig Dispense Refill   acetaminophen (TYLENOL) 325 MG tablet Take 650 mg by mouth every 6 (six) hours as needed.     [START ON 01/04/2021] clonazePAM (KLONOPIN) 0.5 MG tablet Take 1 tablet (0.5 mg total) by mouth 2  (two) times daily. 60 tablet 0   cloNIDine (CATAPRES) 0.1 MG tablet Take 1 tablet (0.1 mg total) by mouth 2 (two) times daily. 60 tablet 0   desvenlafaxine (PRISTIQ) 100 MG 24 hr tablet Take 1 tablet (100 mg total) by mouth daily. 30 tablet 2   diazepam (VALIUM) 5 MG tablet Take 1 tablet (5 mg total) by mouth every 12 (twelve) hours as needed for anxiety. 30 tablet 1   HYDROcodone-acetaminophen (NORCO) 10-325 MG per tablet Take 1 tablet by mouth every 8 (eight) hours as needed. 90 tablet 0   hydrOXYzine (ATARAX/VISTARIL) 25 MG tablet Take 1 tablet (25 mg total) by mouth 3 (three) times daily as needed for anxiety. 90 tablet 1   ibuprofen (ADVIL,MOTRIN) 200 MG tablet Take 200 mg by mouth every 6 (six) hours  as needed for moderate pain.     traZODone (DESYREL) 50 MG tablet Take 1 tablet (50 mg total) by mouth at bedtime as needed for sleep. 30 tablet 1   No current facility-administered medications for this visit.     Musculoskeletal: Strength & Muscle Tone: Unable to assess due to telemedicine visit Gait & Station: Unable to assess due to telemedicine visit Patient leans: Unable to assess due to telemedicine visit  Psychiatric Specialty Exam: Review of Systems  Psychiatric/Behavioral:  Positive for sleep disturbance. Negative for decreased concentration, dysphoric mood, hallucinations, self-injury and suicidal ideas. The patient is nervous/anxious. The patient is not hyperactive.    There were no vitals taken for this visit.There is no height or weight on file to calculate BMI.  General Appearance: Unable to assess due to telemedicine visit  Eye Contact:  Unable to assess due to telemedicine visit  Speech:  Clear and Coherent and Normal Rate  Volume:  Normal  Mood:  Anxious and Euthymic  Affect:  Appropriate and Congruent  Thought Process:  Coherent and Descriptions of Associations: Intact  Orientation:  Full (Time, Place, and Person)  Thought Content: WDL   Suicidal Thoughts:  No   Homicidal Thoughts:  No  Memory:  Immediate;   Good Recent;   Good Remote;   Good  Judgement:  Good  Insight:  Good  Psychomotor Activity:  Normal  Concentration:  Concentration: Good and Attention Span: Good  Recall:  Good  Fund of Knowledge: Good  Language: Good  Akathisia:  NA  Handed:  Right  AIMS (if indicated): not done  Assets:  Communication Skills Desire for Improvement Financial Resources/Insurance Housing Social Support Vocational/Educational  ADL's:  Intact  Cognition: WNL  Sleep:  Fair   Screenings: GAD-7    Flowsheet Row Video Visit from 12/21/2020 in [facility] Video Visit from 10/19/2020 in [facility] Video Visit from 08/08/2020 in [facility]  Total GAD-7 Score 17 16 14       PHQ2-9    Flowsheet Row Video Visit from 12/21/2020 in [facility] Video Visit from 10/19/2020 in [facility] Video Visit from 08/08/2020 in [facility]  PHQ-2 Total Score 1 1 1       Flowsheet Row Video Visit [facility] 12/21/2020 in [facility] Video Visit from 10/19/2020 in [facility] Video Visit from 08/08/2020 in [facility]  C-SSRS RISK CATEGORY Low Risk Low Risk Low Risk        Assessment and Plan:   Dustin Blevins is a 50 year old male with a past psychiatric history significant for Dustin depressive disorder, generalized anxiety disorder, and insomnia who presents to [facility] via virtual telephone visit for follow-up and medication management.  Patient dates that the episodes of anxiety are still present but they have improved vastly since being placed on clonazepam.  Inquired about adjusting the dose for more improvement in his symptoms.  Provider informed patient that due to the  habit-forming nature of the medication, his dose would be kept the same.  Patient was understanding of instruction.  Patient's medications to be e-prescribed to pharmacy of choice.  1. Mild episode of recurrent Dustin depressive disorder (HCC)  - desvenlafaxine (PRISTIQ) 100 MG 24 hr tablet; Take 1 tablet (100 mg total) by mouth daily.  Dispense: 30 tablet; Refill: 2  2. Generalized anxiety disorder  -  desvenlafaxine (PRISTIQ) 100 MG 24 hr tablet; Take 1 tablet (100 mg total) by mouth daily.  Dispense: 30 tablet; Refill: 2 - clonazePAM (KLONOPIN) 0.5 MG tablet; Take 1 tablet (0.5 mg total) by mouth 2 (two) times daily.  Dispense: 60 tablet; Refill: 0  3. Insomnia, unspecified type  - traZODone (DESYREL) 50 MG tablet; Take 1 tablet (50 mg total) by mouth at bedtime as needed for sleep.  Dispense: 30 tablet; Refill: 1   Patient to follow up in 2 months Provider spent a total of 25 minutes with the patient/reviewing chart  Meta HatchetUchenna E Geo Slone, PA 12/21/2020, 9:52 PM

## 2021-02-03 ENCOUNTER — Other Ambulatory Visit (HOSPITAL_COMMUNITY): Payer: Self-pay | Admitting: Physician Assistant

## 2021-02-03 DIAGNOSIS — F411 Generalized anxiety disorder: Secondary | ICD-10-CM

## 2021-02-04 NOTE — Telephone Encounter (Signed)
Patient's medication to be e-prescribed to pharmacy of choice. 

## 2021-02-23 ENCOUNTER — Encounter (HOSPITAL_COMMUNITY): Payer: Self-pay | Admitting: Physician Assistant

## 2021-02-23 ENCOUNTER — Telehealth (INDEPENDENT_AMBULATORY_CARE_PROVIDER_SITE_OTHER): Payer: No Payment, Other | Admitting: Physician Assistant

## 2021-02-23 ENCOUNTER — Other Ambulatory Visit: Payer: Self-pay

## 2021-02-23 DIAGNOSIS — G4700 Insomnia, unspecified: Secondary | ICD-10-CM

## 2021-02-23 DIAGNOSIS — F331 Major depressive disorder, recurrent, moderate: Secondary | ICD-10-CM | POA: Diagnosis not present

## 2021-02-23 DIAGNOSIS — F411 Generalized anxiety disorder: Secondary | ICD-10-CM

## 2021-02-23 MED ORDER — DESVENLAFAXINE SUCCINATE ER 100 MG PO TB24: 100.0000 mg | ORAL_TABLET | Freq: Every day | ORAL | 1 refills | Status: DC

## 2021-02-23 MED ORDER — CLONAZEPAM 0.5 MG PO TABS: 0.5000 mg | ORAL_TABLET | Freq: Three times a day (TID) | ORAL | 0 refills | Status: DC | PRN

## 2021-02-23 MED ORDER — BUPROPION HCL ER (XL) 150 MG PO TB24: 150.0000 mg | ORAL_TABLET | ORAL | 1 refills | Status: AC

## 2021-02-23 MED ORDER — TRAZODONE HCL 50 MG PO TABS: 50.0000 mg | ORAL_TABLET | Freq: Every evening | ORAL | 1 refills | Status: DC | PRN

## 2021-02-23 NOTE — Progress Notes (Addendum)
BH MD/PA/NP OP Progress Note  Virtual Visit via Telephone Note  I connected with Dustin Blevins on 03/02/21 at  5:00 PM EDT by telephone and verified that I am speaking with the correct person using two identifiers.  Location: Patient: Home Provider: Clinic   I discussed the limitations, risks, security and privacy concerns of performing an evaluation and management service by telephone and the availability of in person appointments. I also discussed with the patient that there may be a patient responsible charge related to this service. The patient expressed understanding and agreed to proceed.  Follow Up Instructions:  I discussed the assessment and treatment plan with the patient. The patient was provided an opportunity to ask questions and all were answered. The patient agreed with the plan and demonstrated an understanding of the instructions.   The patient was advised to call back or seek an in-person evaluation if the symptoms worsen or if the condition fails to improve as anticipated.  I provided 17 minutes of non-face-to-face time during this encounter.  Meta Hatchet, PA   03/02/2021 3:29 AM [name]  MRN:  [id number]  Chief Complaint: Follow up and medication management  HPI:   Mykeal Carrick is a 50 year old male with a past psychiatric history significant for generalized anxiety disorder, insomnia, and major depressive disorder who presents to [facility] via virtual telephone visit for follow-up and medication management.  Patient is currently being managed on the following medications:  Desvenlafaxine (Pristiq) 100 mg 24-hour tablet Clonazepam (Klonopin) 0.5 mg 2 times daily Trazodone 50 mg at bedtime  Patient reports that he is currently doing okay.  He reports that his previous night was awful mood-wise.  Patient states that he tends to get in a "funk" around the same time this year.  Patient has been  mentally low for the past couple of weeks yet he cannot attribute any of his low mood to significant events in his life.  Patient states that he is still working his 9-5 job but it appears that his depression is starting to set him.  Patient endorses the following depressive symptoms: excessive worrying, lack of joy, difficulty getting out of bed, and decreased appetite.  Patient endorses fluctuating anxiety stating that his anxiety runs between a 1 out of 10 and 7 out of 10.  He also states that his anxiety is accompanied by restlessness and tapping his legs. Right now he rates his anxiety at 4 out of 10.  Patient is unable to attribute any new triggers or stressors to his anxiety.  Patient is alert and oriented x4, calm, cooperative, and fully engaged in conversation during the encounter.  Patient reports that he is doing okay and he clarifies that he is barely okay.  Patient denies suicidal or homicidal ideations.  He further denies auditory or visual hallucinations and does not appear to be responding to internal/external stimuli.  Patient endorses difficulty sleeping stating that he has had some pretty rough nights lately.  Normally he is able to receive roughly 7 to 8 hours of sleep each night but recently, he has been experiencing 3 to 5 hours of sleep per night.  Patient endorses decreased appetite and eats on average 2 meals per day.  Patient denies alcohol consumption and illicit drug use.  Patient endorses tobacco use and smokes on average 2 cigarettes/day.  Visit Diagnosis:    ICD-10-CM   1. Insomnia, unspecified type  G47.00 traZODone (DESYREL) 50 MG tablet  2. Generalized anxiety disorder  F41.1 clonazePAM (KLONOPIN) 0.5 MG tablet    desvenlafaxine (PRISTIQ) 100 MG 24 hr tablet    3. Moderate episode of recurrent major depressive disorder (HCC)  F33.1 desvenlafaxine (PRISTIQ) 100 MG 24 hr tablet    buPROPion (WELLBUTRIN XL) 150 MG 24 hr tablet      Past Psychiatric History:  Major  depressive disorder Generalized anxiety disorder Insomnia  Past Medical History:  Past Medical History:  Diagnosis Date   Anxiety    Back pain     Past Surgical History:  Procedure Laterality Date   ANKLE SURGERY      Family Psychiatric History:  Unknown, but patient does report that his mother was on anti-depressants in the past  Family History:  Family History  Problem Relation Age of Onset   Kidney Stones Father    Cancer Father 48       Prostate   Cancer Maternal Grandmother        cervical   Healthy Child        1 years old currently    Social History:  Social History   Socioeconomic History   Marital status: Single    Spouse name: Not on file   Number of children: 1   Years of education: Not on file   Highest education level: Not on file  Occupational History   Occupation: Advertising account planner  Tobacco Use   Smoking status: Former    Types: Cigarettes    Quit date: 01/11/2000    Years since quitting: 21.1   Smokeless tobacco: Never   Tobacco comments:    rare use when smoking -- < 1 pack per week  Substance and Sexual Activity   Alcohol use: Yes    Alcohol/week: 0.0 standard drinks    Comment: daily    Drug use: Yes    Frequency: 1.0 times per week    Types: Cocaine   Sexual activity: Yes    Partners: Female  Other Topics Concern   Not on file  Social History Narrative   Not on file   Social Determinants of Health   Financial Resource Strain: Not on file  Food Insecurity: Not on file  Transportation Needs: Not on file  Physical Activity: Not on file  Stress: Not on file  Social Connections: Not on file    Allergies: Not on File  Metabolic Disorder Labs: No results found for: HGBA1C, MPG No results found for: PROLACTIN No results found for: CHOL, TRIG, HDL, CHOLHDL, VLDL, LDLCALC No results found for: TSH  Therapeutic Level Labs: No results found for: LITHIUM No results found for: VALPROATE No components found for:  CBMZ  Current  Medications: Current Outpatient Medications  Medication Sig Dispense Refill   buPROPion (WELLBUTRIN XL) 150 MG 24 hr tablet Take 1 tablet (150 mg total) by mouth every morning. 30 tablet 1   acetaminophen (TYLENOL) 325 MG tablet Take 650 mg by mouth every 6 (six) hours as needed.     [START ON 03/06/2021] clonazePAM (KLONOPIN) 0.5 MG tablet Take 1 tablet (0.5 mg total) by mouth 3 (three) times daily as needed for anxiety. 90 tablet 0   cloNIDine (CATAPRES) 0.1 MG tablet Take 1 tablet (0.1 mg total) by mouth 2 (two) times daily. 60 tablet 0   desvenlafaxine (PRISTIQ) 100 MG 24 hr tablet Take 1 tablet (100 mg total) by mouth daily. 30 tablet 1   HYDROcodone-acetaminophen (NORCO) 10-325 MG per tablet Take 1 tablet by mouth every 8 (eight)  hours as needed. 90 tablet 0   hydrOXYzine (ATARAX/VISTARIL) 25 MG tablet Take 1 tablet (25 mg total) by mouth 3 (three) times daily as needed for anxiety. 90 tablet 1   ibuprofen (ADVIL,MOTRIN) 200 MG tablet Take 200 mg by mouth every 6 (six) hours as needed for moderate pain.     traZODone (DESYREL) 50 MG tablet Take 1 tablet (50 mg total) by mouth at bedtime as needed for sleep. 30 tablet 1   No current facility-administered medications for this visit.     Musculoskeletal: Strength & Muscle Tone: Unable to assess due to telemedicine visit Gait & Station: Unable to assess due to telemedicine visit Patient leans: Unable to assess due to telemedicine visit  Psychiatric Specialty Exam: Review of Systems  Psychiatric/Behavioral:  Positive for dysphoric mood and sleep disturbance. Negative for decreased concentration, hallucinations, self-injury and suicidal ideas. The patient is nervous/anxious. The patient is not hyperactive.    There were no vitals taken for this visit.There is no height or weight on file to calculate BMI.  General Appearance: Unable to assess due to telemedicine visit  Eye Contact:  Unable to assess due to telemedicine visit  Speech:  Clear  and Coherent and Normal Rate  Volume:  Normal  Mood:  Anxious, Depressed, and Dysphoric  Affect:  Congruent and Depressed  Thought Process:  Coherent, Goal Directed, and Descriptions of Associations: Intact  Orientation:  Full (Time, Place, and Person)  Thought Content: WDL   Suicidal Thoughts:  No  Homicidal Thoughts:  No  Memory:  Immediate;   Good Recent;   Good Remote;   Good  Judgement:  Good  Insight:  Good  Psychomotor Activity:  Restlessness  Concentration:  Concentration: Good and Attention Span: Good  Recall:  Good  Fund of Knowledge: Good  Language: Good  Akathisia:  NA  Handed:  Right  AIMS (if indicated): not done  Assets:  Communication Skills Desire for Improvement Housing Social Support Vocational/Educational  ADL's:  Intact  Cognition: WNL  Sleep:  Poor   Screenings: GAD-7    Flowsheet Row Video Visit from 02/23/2021 in [facility] Video Visit from 12/21/2020 in [facility] Video Visit from 10/19/2020 in [facility] Video Visit from 08/08/2020 in [facility]  Total GAD-7 Score 17 17 16 14       PHQ2-9    Flowsheet Row Video Visit from 02/23/2021 in [facility] Video Visit from 12/21/2020 in [facility] Video Visit from 10/19/2020 in [facility] Video Visit from 08/08/2020 in [facility]  PHQ-2 Total Score 3 1 1 1   PHQ-9 Total Score 15 -- -- --      Flowsheet Row Video Visit from 02/23/2021 in [facility] Video Visit from 12/21/2020 in [facility] Video Visit from 10/19/2020 in [facility]  C-SSRS RISK CATEGORY Low Risk Low Risk Low Risk        Assessment and Plan:   Revan Gendron is a 50 year old male with a past psychiatric history  significant for generalized anxiety disorder, insomnia, and major depressive disorder who presents to [facility]patient clinic via virtual telephone visit for follow-up and medication management.  Patient reports that he has been experiencing worsening depressive episodes, fluctuating anxiety, and poor sleep.  Patient is already currently taking the max level of Pristiq.  For the management of the patient's anxiety provider to increase his clonazepam dosage from 0.5 mg 2 times daily to 0.5 mg 3 times daily as needed.  Provider also recommended adding Wellbutrin 150 mg daily to the patient's regimen for the management of his depressive symptoms.  Patient was agreeable to recommendations.  Patient's medications to be e-prescribed to pharmacy of choice.  1. Insomnia, unspecified type  - traZODone (DESYREL) 50 MG tablet; Take 1 tablet (50 mg total) by mouth at bedtime as needed for sleep.  Dispense: 30 tablet; Refill: 1  2. Generalized anxiety disorder  - clonazePAM (KLONOPIN) 0.5 MG tablet; Take 1 tablet (0.5 mg total) by mouth 3 (three) times daily as needed for anxiety.  Dispense: 90 tablet; Refill: 0 - desvenlafaxine (PRISTIQ) 100 MG 24 hr tablet; Take 1 tablet (100 mg total) by mouth daily.  Dispense: 30 tablet; Refill: 1  3. Moderate episode of recurrent major depressive disorder (HCC)  - desvenlafaxine (PRISTIQ) 100 MG 24 hr tablet; Take 1 tablet (100 mg total) by mouth daily.  Dispense: 30 tablet; Refill: 1 - buPROPion (WELLBUTRIN XL) 150 MG 24 hr tablet; Take 1 tablet (150 mg total) by mouth every morning.  Dispense: 30 tablet; Refill: 1  Patient to follow up in 6 weeks Provider spent a total of 17 minutes with the patient/reviewing the patient's chart  Meta Hatchet, PA 03/02/2021, 3:29 AM

## 2021-03-26 ENCOUNTER — Other Ambulatory Visit (HOSPITAL_COMMUNITY): Payer: Self-pay | Admitting: Physician Assistant

## 2021-03-26 DIAGNOSIS — F411 Generalized anxiety disorder: Secondary | ICD-10-CM

## 2021-03-27 ENCOUNTER — Telehealth (HOSPITAL_COMMUNITY): Payer: Self-pay | Admitting: *Deleted

## 2021-03-27 NOTE — Telephone Encounter (Signed)
Pt requesting refills clonazePAM (KLONOPIN) 0.5 MG tablet --send to [facility]

## 2021-03-28 ENCOUNTER — Other Ambulatory Visit (HOSPITAL_COMMUNITY): Payer: Self-pay | Admitting: Physician Assistant

## 2021-03-28 DIAGNOSIS — F411 Generalized anxiety disorder: Secondary | ICD-10-CM

## 2021-03-28 MED ORDER — CLONAZEPAM 0.5 MG PO TABS: 0.5000 mg | ORAL_TABLET | Freq: Three times a day (TID) | ORAL | 0 refills | Status: DC | PRN

## 2021-03-28 NOTE — Progress Notes (Signed)
Provider was contacted by Irene Pap, CMA regarding medication refill. Patient's medication to be e-prescribed to pharmacy of choice.  Recorded date that patient's Klonopin was filled is 03/28/2021. Medication should last patient until 11/25.

## 2021-03-28 NOTE — Telephone Encounter (Signed)
Provider was contacted by Dustin Blevins, CMA regarding medication refill. Patient's medication to be e-prescribed to pharmacy of choice.

## 2021-03-29 ENCOUNTER — Other Ambulatory Visit (HOSPITAL_COMMUNITY): Payer: Self-pay | Admitting: Physician Assistant

## 2021-03-29 DIAGNOSIS — F411 Generalized anxiety disorder: Secondary | ICD-10-CM

## 2021-04-03 ENCOUNTER — Telehealth (HOSPITAL_COMMUNITY): Payer: No Payment, Other | Admitting: Physician Assistant

## 2021-04-11 ENCOUNTER — Telehealth (INDEPENDENT_AMBULATORY_CARE_PROVIDER_SITE_OTHER): Payer: No Payment, Other | Admitting: Physician Assistant

## 2021-04-11 ENCOUNTER — Encounter (HOSPITAL_COMMUNITY): Payer: Self-pay | Admitting: Physician Assistant

## 2021-04-11 DIAGNOSIS — G4700 Insomnia, unspecified: Secondary | ICD-10-CM

## 2021-04-11 DIAGNOSIS — F411 Generalized anxiety disorder: Secondary | ICD-10-CM

## 2021-04-11 DIAGNOSIS — F331 Major depressive disorder, recurrent, moderate: Secondary | ICD-10-CM

## 2021-04-11 MED ORDER — TRAZODONE HCL 100 MG PO TABS: 100.0000 mg | ORAL_TABLET | Freq: Every evening | ORAL | 1 refills | Status: DC | PRN

## 2021-04-11 MED ORDER — CLONAZEPAM 0.5 MG PO TABS: 0.5000 mg | ORAL_TABLET | Freq: Three times a day (TID) | ORAL | 0 refills | Status: DC | PRN

## 2021-04-11 MED ORDER — DESVENLAFAXINE SUCCINATE ER 100 MG PO TB24: 100.0000 mg | ORAL_TABLET | Freq: Every day | ORAL | 1 refills | Status: DC

## 2021-04-11 NOTE — Progress Notes (Addendum)
BH MD/PA/NP OP Progress Note  Virtual Visit via Telephone Note  I connected with Dustin Blevins on 04/11/21 at 10:00 AM EST by telephone and verified that I am speaking with the correct person using two identifiers.  Location: Patient: Home  Provider: Clinic   I discussed the limitations, risks, security and privacy concerns of performing an evaluation and management service by telephone and the availability of in person appointments. I also discussed with the patient that there may be a patient responsible charge related to this service. The patient expressed understanding and agreed to proceed.  Follow Up Instructions:   I discussed the assessment and treatment plan with the patient. The patient was provided an opportunity to ask questions and all were answered. The patient agreed with the plan and demonstrated an understanding of the instructions.   The patient was advised to call back or seek an in-person evaluation if the symptoms worsen or if the condition fails to improve as anticipated.  I provided 13 minutes of non-face-to-face time during this encounter.  Meta Hatchet, PA    04/11/2021 3:26 PM [name]  MRN:  [id number]  Chief Complaint: Follow up and medication management  HPI:   Dustin Blevins is a 50 year old male with a past psychiatric history significant for generalized anxiety disorder, major depressive disorder, and insomnia who presents to [facility] via virtual telephone visit for follow-up medication management.  Patient is currently being managed on the following medications:  Trazodone 50 mg at bedtime Clonazepam 0.5 mg 3 times daily as needed Pristiq 100 mg 24-hour tablet daily Bupropion (Wellbutrin XL) 150 mg 24-hour tablet daily  Patient reports that he discontinued taking Wellbutrin stating that the medication made him feel weird and more depressed.  Since discontinuing his Wellbutrin, patient  states that he feels back to normal.  Patient endorses elevated anxiety and rates his anxiety at an 8 out of 10.  He reports that Klonopin has been helpful in the management of his anxiety.  Patient reports that he has been having issues with his sleep again and has found using trazodone 100 mg at bedtime helpful in the management of his sleep disturbances.  The only stressor that the patient endorses involves finances.  Patient is alert and oriented x4, calm, cooperative, and fully engaged in conversation during the encounter.  Patient states that he feels levelheaded but feels down more than usual.  Patient denies suicidal or homicidal ideations.  He further denies auditory or visual hallucinations and does not appear to be responding to internal/external stimuli.  Patient reports that he did not sleep at all for the previous night.  He states that he normally gets on average 6 hours of sleep each night.  Patient endorses good appetite and eats on average 3 meals per day.  Patient denies alcohol consumption and illicit drug use.  Patient endorses tobacco use sparingly.  Visit Diagnosis:    ICD-10-CM   1. Generalized anxiety disorder  F41.1 clonazePAM (KLONOPIN) 0.5 MG tablet    desvenlafaxine (PRISTIQ) 100 MG 24 hr tablet    2. Moderate episode of recurrent major depressive disorder (HCC)  F33.1 desvenlafaxine (PRISTIQ) 100 MG 24 hr tablet    3. Insomnia, unspecified type  G47.00 traZODone (DESYREL) 100 MG tablet      Past Psychiatric History:  Major depressive disorder Generalized anxiety disorder Insomnia  Past Medical History:  Past Medical History:  Diagnosis Date   Anxiety    Back pain  Past Surgical History:  Procedure Laterality Date   ANKLE SURGERY      Family Psychiatric History:  Unknown, but patient does report that his mother was on anti-depressants in the past  Family History:  Family History  Problem Relation Age of Onset   Kidney Stones Father    Cancer  Father 82       Prostate   Cancer Maternal Grandmother        cervical   Healthy Child        52 years old currently    Social History:  Social History   Socioeconomic History   Marital status: Single    Spouse name: Not on file   Number of children: 1   Years of education: Not on file   Highest education level: Not on file  Occupational History   Occupation: Advertising account planner  Tobacco Use   Smoking status: Former    Types: Cigarettes    Quit date: 01/11/2000    Years since quitting: 21.2   Smokeless tobacco: Never   Tobacco comments:    rare use when smoking -- < 1 pack per week  Substance and Sexual Activity   Alcohol use: Yes    Alcohol/week: 0.0 standard drinks    Comment: daily    Drug use: Yes    Frequency: 1.0 times per week    Types: Cocaine   Sexual activity: Yes    Partners: Female  Other Topics Concern   Not on file  Social History Narrative   Not on file   Social Determinants of Health   Financial Resource Strain: Not on file  Food Insecurity: Not on file  Transportation Needs: Not on file  Physical Activity: Not on file  Stress: Not on file  Social Connections: Not on file    Allergies: Not on File  Metabolic Disorder Labs: No results found for: HGBA1C, MPG No results found for: PROLACTIN No results found for: CHOL, TRIG, HDL, CHOLHDL, VLDL, LDLCALC No results found for: TSH  Therapeutic Level Labs: No results found for: LITHIUM No results found for: VALPROATE No components found for:  CBMZ  Current Medications: Current Outpatient Medications  Medication Sig Dispense Refill   acetaminophen (TYLENOL) 325 MG tablet Take 650 mg by mouth every 6 (six) hours as needed.     buPROPion (WELLBUTRIN XL) 150 MG 24 hr tablet Take 1 tablet (150 mg total) by mouth every morning. 30 tablet 1   [START ON 04/27/2021] clonazePAM (KLONOPIN) 0.5 MG tablet Take 1 tablet (0.5 mg total) by mouth 3 (three) times daily as needed for anxiety. 90 tablet 0    cloNIDine (CATAPRES) 0.1 MG tablet Take 1 tablet (0.1 mg total) by mouth 2 (two) times daily. 60 tablet 0   desvenlafaxine (PRISTIQ) 100 MG 24 hr tablet Take 1 tablet (100 mg total) by mouth daily. 30 tablet 1   HYDROcodone-acetaminophen (NORCO) 10-325 MG per tablet Take 1 tablet by mouth every 8 (eight) hours as needed. 90 tablet 0   hydrOXYzine (ATARAX/VISTARIL) 25 MG tablet Take 1 tablet (25 mg total) by mouth 3 (three) times daily as needed for anxiety. 90 tablet 1   ibuprofen (ADVIL,MOTRIN) 200 MG tablet Take 200 mg by mouth every 6 (six) hours as needed for moderate pain.     traZODone (DESYREL) 100 MG tablet Take 1 tablet (100 mg total) by mouth at bedtime as needed for sleep. 30 tablet 1   No current facility-administered medications for this visit.  Musculoskeletal: Strength & Muscle Tone: Unable to assess due to telemedicine visit Gait & Station: Unable to assess due to telemedicine visit Patient leans: Unable to assess due to telemedicine visit  Psychiatric Specialty Exam: Review of Systems  Psychiatric/Behavioral:  Positive for sleep disturbance. Negative for decreased concentration, dysphoric mood, hallucinations, self-injury and suicidal ideas. The patient is nervous/anxious. The patient is not hyperactive.    There were no vitals taken for this visit.There is no height or weight on file to calculate BMI.  General Appearance: Unable to assess due to telemedicine visit  Eye Contact:  Unable to assess due to telemedicine visit  Speech:  Clear and Coherent and Normal Rate  Volume:  Normal  Mood:  Anxious and Euthymic  Affect:  Appropriate and Congruent  Thought Process:  Coherent, Goal Directed, and Descriptions of Associations: Intact  Orientation:  Full (Time, Place, and Person)  Thought Content: WDL   Suicidal Thoughts:  No  Homicidal Thoughts:  No  Memory:  Immediate;   Good Recent;   Good Remote;   Good  Judgement:  Good  Insight:  Good  Psychomotor Activity:   Restlessness  Concentration:  Concentration: Good and Attention Span: Good  Recall:  Good  Fund of Knowledge: Good  Language: Good  Akathisia:  NA  Handed:  Right  AIMS (if indicated): not done  Assets:  Communication Skills Desire for Improvement Housing Social Support Vocational/Educational  ADL's:  Intact  Cognition: WNL  Sleep:  Poor   Screenings: GAD-7    Flowsheet Row Video Visit from 04/11/2021 in [facility] Video Visit from 02/23/2021 in [facility] Video Visit from 12/21/2020 in [facility] Video Visit from 10/19/2020 in [facility] Video Visit from 08/08/2020 in [facility]  Total GAD-7 Score 10 17 17 16 14       PHQ2-9    Flowsheet Row Video Visit from 04/11/2021 in [facility] Video Visit from 02/23/2021 in [facility] Video Visit from 12/21/2020 in [facility] Video Visit from 10/19/2020 in [facility] Video Visit from 08/08/2020 in [facility]  PHQ-2 Total Score 0 3 1 1 1   PHQ-9 Total Score -- 15 -- -- --      Flowsheet Row Video Visit from 04/11/2021 in [facility] Video Visit from 02/23/2021 in [facility] Video Visit from 12/21/2020 in [facility]  C-SSRS RISK CATEGORY Low Risk Low Risk Low Risk        Assessment and Plan:   Rayshard Schirtzinger is a 50 year old male with a past psychiatric history significant for generalized anxiety disorder, major depressive disorder, and insomnia who presents to [facility] via virtual telephone visit for follow-up medication management.  Patient reports that he discontinued taking Wellbutrin after experiencing worsening  depression.  Although he feels levelheaded since discontinuing his Wellbutrin, he still endorses some mild depression and elevated anxiety.  Patient also states that he has noticed some sleep disturbances lately.  Provider to prescribe trazodone 100 mg at bedtime for the management of the patient's sleep.  Patient was encouraged to continue taking medications as prescribed.  Patient was agreeable to recommendation.  Patient's medications to be e-prescribed to pharmacy of choice.  1. Generalized anxiety disorder  - clonazePAM (KLONOPIN) 0.5 MG tablet; Take 1 tablet (0.5  mg total) by mouth 3 (three) times daily as needed for anxiety.  Dispense: 90 tablet; Refill: 0 - desvenlafaxine (PRISTIQ) 100 MG 24 hr tablet; Take 1 tablet (100 mg total) by mouth daily.  Dispense: 30 tablet; Refill: 1  2. Moderate episode of recurrent major depressive disorder (HCC)  - desvenlafaxine (PRISTIQ) 100 MG 24 hr tablet; Take 1 tablet (100 mg total) by mouth daily.  Dispense: 30 tablet; Refill: 1  3. Insomnia, unspecified type  - traZODone (DESYREL) 100 MG tablet; Take 1 tablet (100 mg total) by mouth at bedtime as needed for sleep.  Dispense: 30 tablet; Refill: 1  Patient to follow up in 7 weeks Provider spent a total of 13 minutes with the patient/reviewing patient's chart  Meta Hatchet, PA 04/11/2021, 3:26 PM

## 2021-04-30 ENCOUNTER — Other Ambulatory Visit (HOSPITAL_COMMUNITY): Payer: Self-pay | Admitting: Physician Assistant

## 2021-04-30 DIAGNOSIS — F331 Major depressive disorder, recurrent, moderate: Secondary | ICD-10-CM

## 2021-04-30 DIAGNOSIS — F411 Generalized anxiety disorder: Secondary | ICD-10-CM

## 2021-04-30 DIAGNOSIS — G4700 Insomnia, unspecified: Secondary | ICD-10-CM

## 2021-04-30 MED ORDER — DESVENLAFAXINE SUCCINATE ER 100 MG PO TB24: 100.0000 mg | ORAL_TABLET | Freq: Every day | ORAL | 1 refills | Status: AC

## 2021-04-30 MED ORDER — TRAZODONE HCL 100 MG PO TABS: 100.0000 mg | ORAL_TABLET | Freq: Every evening | ORAL | 1 refills | Status: AC | PRN

## 2021-04-30 MED ORDER — CLONAZEPAM 0.5 MG PO TABS: 0.5000 mg | ORAL_TABLET | Freq: Three times a day (TID) | ORAL | 0 refills | Status: DC | PRN

## 2021-04-30 NOTE — Progress Notes (Signed)
Patient's medication was sent to appropriate pharmacy.

## 2021-05-29 ENCOUNTER — Other Ambulatory Visit (HOSPITAL_COMMUNITY): Payer: Self-pay | Admitting: Physician Assistant

## 2021-05-29 ENCOUNTER — Telehealth (HOSPITAL_COMMUNITY): Payer: Self-pay | Admitting: *Deleted

## 2021-05-29 DIAGNOSIS — F411 Generalized anxiety disorder: Secondary | ICD-10-CM

## 2021-05-29 MED ORDER — CLONAZEPAM 0.5 MG PO TABS: 0.5000 mg | ORAL_TABLET | Freq: Three times a day (TID) | ORAL | 0 refills | Status: AC | PRN

## 2021-05-29 NOTE — Telephone Encounter (Signed)
VM from patient requesting a new rx for his Klonopin. Reviewed record and he should be out. He has an appt with Eddie on 06/01/21. Will make request to the provider.

## 2021-05-29 NOTE — Telephone Encounter (Signed)
Provider was contacted by Suzanne K Beck, RN regarding medication refill. Patient's medication to be e-prescribed to pharmacy of choice. 

## 2021-05-29 NOTE — Progress Notes (Signed)
Provider was contacted by Suzanne K Beck, RN regarding medication refill. Patient's medication to be e-prescribed to pharmacy of choice. 

## 2021-06-01 ENCOUNTER — Telehealth (HOSPITAL_COMMUNITY): Payer: No Payment, Other | Admitting: Physician Assistant
